# Patient Record
Sex: Female | Born: 1945 | Race: White | Hispanic: No | Smoking: Current every day smoker
Health system: Southern US, Community
[De-identification: ages and names within clinical notes are randomized; demographics above are authoritative.]

## PROBLEM LIST (undated history)

## (undated) DIAGNOSIS — T7840XA Allergy, unspecified, initial encounter: Secondary | ICD-10-CM

## (undated) DIAGNOSIS — I10 Essential (primary) hypertension: Secondary | ICD-10-CM

## (undated) DIAGNOSIS — F329 Major depressive disorder, single episode, unspecified: Secondary | ICD-10-CM

## (undated) DIAGNOSIS — J45909 Unspecified asthma, uncomplicated: Secondary | ICD-10-CM

## (undated) DIAGNOSIS — M1990 Unspecified osteoarthritis, unspecified site: Secondary | ICD-10-CM

## (undated) DIAGNOSIS — R32 Unspecified urinary incontinence: Secondary | ICD-10-CM

## (undated) DIAGNOSIS — J439 Emphysema, unspecified: Secondary | ICD-10-CM

## (undated) DIAGNOSIS — F1010 Alcohol abuse, uncomplicated: Secondary | ICD-10-CM

## (undated) DIAGNOSIS — F32A Depression, unspecified: Secondary | ICD-10-CM

## (undated) DIAGNOSIS — Z8619 Personal history of other infectious and parasitic diseases: Secondary | ICD-10-CM

## (undated) HISTORY — DX: Alcohol abuse, uncomplicated: F10.10

## (undated) HISTORY — DX: Personal history of other infectious and parasitic diseases: Z86.19

## (undated) HISTORY — DX: Major depressive disorder, single episode, unspecified: F32.9

## (undated) HISTORY — DX: Emphysema, unspecified: J43.9

## (undated) HISTORY — DX: Essential (primary) hypertension: I10

## (undated) HISTORY — DX: Unspecified osteoarthritis, unspecified site: M19.90

## (undated) HISTORY — DX: Allergy, unspecified, initial encounter: T78.40XA

## (undated) HISTORY — DX: Depression, unspecified: F32.A

## (undated) HISTORY — DX: Unspecified urinary incontinence: R32

## (undated) HISTORY — DX: Unspecified asthma, uncomplicated: J45.909

---

## 1983-06-13 HISTORY — PX: TUBAL LIGATION: SHX77

## 2016-07-28 DIAGNOSIS — Z720 Tobacco use: Secondary | ICD-10-CM | POA: Diagnosis not present

## 2016-07-28 DIAGNOSIS — G4730 Sleep apnea, unspecified: Secondary | ICD-10-CM | POA: Diagnosis not present

## 2016-12-20 DIAGNOSIS — E559 Vitamin D deficiency, unspecified: Secondary | ICD-10-CM | POA: Diagnosis not present

## 2016-12-20 DIAGNOSIS — E038 Other specified hypothyroidism: Secondary | ICD-10-CM | POA: Diagnosis not present

## 2016-12-20 DIAGNOSIS — R748 Abnormal levels of other serum enzymes: Secondary | ICD-10-CM | POA: Diagnosis not present

## 2016-12-20 DIAGNOSIS — I10 Essential (primary) hypertension: Secondary | ICD-10-CM | POA: Diagnosis not present

## 2016-12-20 DIAGNOSIS — D7589 Other specified diseases of blood and blood-forming organs: Secondary | ICD-10-CM | POA: Diagnosis not present

## 2016-12-20 DIAGNOSIS — Z6820 Body mass index (BMI) 20.0-20.9, adult: Secondary | ICD-10-CM | POA: Diagnosis not present

## 2016-12-20 DIAGNOSIS — J441 Chronic obstructive pulmonary disease with (acute) exacerbation: Secondary | ICD-10-CM | POA: Diagnosis not present

## 2016-12-20 DIAGNOSIS — E7800 Pure hypercholesterolemia, unspecified: Secondary | ICD-10-CM | POA: Diagnosis not present

## 2016-12-20 DIAGNOSIS — R945 Abnormal results of liver function studies: Secondary | ICD-10-CM | POA: Diagnosis not present

## 2016-12-30 DIAGNOSIS — L301 Dyshidrosis [pompholyx]: Secondary | ICD-10-CM | POA: Diagnosis not present

## 2016-12-30 DIAGNOSIS — S90822A Blister (nonthermal), left foot, initial encounter: Secondary | ICD-10-CM | POA: Diagnosis not present

## 2016-12-30 DIAGNOSIS — L089 Local infection of the skin and subcutaneous tissue, unspecified: Secondary | ICD-10-CM | POA: Diagnosis not present

## 2017-02-20 DIAGNOSIS — Z23 Encounter for immunization: Secondary | ICD-10-CM | POA: Diagnosis not present

## 2017-06-27 DIAGNOSIS — E7800 Pure hypercholesterolemia, unspecified: Secondary | ICD-10-CM | POA: Diagnosis not present

## 2017-06-27 DIAGNOSIS — E559 Vitamin D deficiency, unspecified: Secondary | ICD-10-CM | POA: Diagnosis not present

## 2017-06-27 DIAGNOSIS — E038 Other specified hypothyroidism: Secondary | ICD-10-CM | POA: Diagnosis not present

## 2017-06-27 DIAGNOSIS — I10 Essential (primary) hypertension: Secondary | ICD-10-CM | POA: Diagnosis not present

## 2017-06-27 DIAGNOSIS — R748 Abnormal levels of other serum enzymes: Secondary | ICD-10-CM | POA: Diagnosis not present

## 2017-06-27 DIAGNOSIS — D7589 Other specified diseases of blood and blood-forming organs: Secondary | ICD-10-CM | POA: Diagnosis not present

## 2017-08-17 DIAGNOSIS — J209 Acute bronchitis, unspecified: Secondary | ICD-10-CM | POA: Diagnosis not present

## 2018-03-05 DIAGNOSIS — Z23 Encounter for immunization: Secondary | ICD-10-CM | POA: Diagnosis not present

## 2018-05-28 ENCOUNTER — Encounter: Payer: Self-pay | Admitting: Family Medicine

## 2018-05-28 ENCOUNTER — Ambulatory Visit (INDEPENDENT_AMBULATORY_CARE_PROVIDER_SITE_OTHER): Payer: Medicare Other | Admitting: Family Medicine

## 2018-05-28 VITALS — BP 122/60 | HR 84 | Temp 98.2°F | Resp 16 | Ht 64.0 in | Wt 140.4 lb

## 2018-05-28 DIAGNOSIS — F324 Major depressive disorder, single episode, in partial remission: Secondary | ICD-10-CM | POA: Diagnosis not present

## 2018-05-28 DIAGNOSIS — J449 Chronic obstructive pulmonary disease, unspecified: Secondary | ICD-10-CM

## 2018-05-28 DIAGNOSIS — I10 Essential (primary) hypertension: Secondary | ICD-10-CM

## 2018-05-28 DIAGNOSIS — F17200 Nicotine dependence, unspecified, uncomplicated: Secondary | ICD-10-CM

## 2018-05-28 DIAGNOSIS — F329 Major depressive disorder, single episode, unspecified: Secondary | ICD-10-CM | POA: Insufficient documentation

## 2018-05-28 MED ORDER — ALBUTEROL SULFATE HFA 108 (90 BASE) MCG/ACT IN AERS: 2.0000 | INHALATION_SPRAY | Freq: Four times a day (QID) | RESPIRATORY_TRACT | 6 refills | Status: AC | PRN

## 2018-05-28 MED ORDER — BENAZEPRIL-HYDROCHLOROTHIAZIDE 20-12.5 MG PO TABS: 1.0000 | ORAL_TABLET | Freq: Every day | ORAL | 3 refills | Status: DC

## 2018-05-28 NOTE — Progress Notes (Signed)
HPI:   Lorraine Fitzgerald is a 72 y.o. female, who is here today to establish care.  Former PCP: Dianna Limbo, NP. She just moved from [place]. Last preventive routine visit: 07/2017.  Chronic medical problems: COPD,knee OA,allergic rhinitis,depression,HTN She lives alone, her daughter lives in town.  HTN: Dx in 2009. She does not check BP. Last eye exam a year ago. She is on Benazepril-HCTZ 20-12.5 mg daily.  Denies severe/frequent headache, visual changes, chest pain, palpitation, claudication, focal weakness, or edema.  COPD,she is on Albuterol inh prn and Fluticonasone-Salmeterol inh 2 puff daily. Denies worsening cough, SOB,or wheezing. Symptoms exacerbated by weather changes.  Still smoker.   Depression,Dx in 1995. Past Hx of alcohol abuse. She attends AA meetings, quit about 2 years ago.  She is on Sertraline 100 mg daily,which is helping with symptoms. Denies depressed mood or suicidal thoughts.    Review of Systems  Constitutional: Negative for activity change, appetite change, fatigue, fever and unexpected weight change.  HENT: Negative for mouth sores, nosebleeds and trouble swallowing.   Eyes: Negative for redness and visual disturbance.  Respiratory: Positive for cough, shortness of breath and wheezing.   Cardiovascular: Negative for chest pain, palpitations and leg swelling.  Gastrointestinal: Negative for abdominal pain, nausea and vomiting.       Negative for changes in bowel habits.  Genitourinary: Negative for decreased urine volume, dysuria and hematuria.  Musculoskeletal: Positive for arthralgias. Negative for gait problem.  Neurological: Negative for syncope, weakness and headaches.  Psychiatric/Behavioral: Negative for confusion. The patient is nervous/anxious.       Current Outpatient Medications on File Prior to Visit  Medication Sig Dispense Refill  . ALBUTEROL IN Inhale into the lungs as needed.    . Arnica 20 % TINC Apply  topically as needed. topical    . azelastine (ASTELIN) 0.1 % nasal spray Place 1 spray into both nostrils 2 (two) times daily. Use in each nostril as directed    . betamethasone valerate (VALISONE) 0.1 % cream Apply 1 application topically as needed.    Marland Kitchen CALCIUM-MAG-VIT C-VIT D PO Take by mouth. Liquid, uses off and on    . Fluticasone-Salmeterol (WIXELA INHUB) 250-50 MCG/DOSE AEPB Inhale 1 puff into the lungs 2 (two) times daily.    . Multiple Vitamin (MULTIVITAMIN) tablet Take 1 tablet by mouth daily.    . Nettle, Urtica Dioica, (NETTLE LEAF PO) Take 500 mg by mouth as needed.    . sertraline (ZOLOFT) 100 MG tablet Take 100 mg by mouth daily.     No current facility-administered medications on file prior to visit.      Past Medical History:  Diagnosis Date  . Alcohol abuse   . Allergy   . Arthritis   . Asthma   . Depression   . Emphysema of lung (HCC)   . History of chicken pox   . Hypertension   . Urine incontinence    No Known Allergies  Family History  Problem Relation Age of Onset  . COPD Mother   . Early death Mother   . Miscarriages / [place] Mother   . Rheumatic fever Mother   . Hearing loss Father   . Alcohol abuse Brother   . Cancer Brother   . Drug abuse Brother   . Heart disease Brother   . Alcohol abuse Son   . Arthritis Son   . Drug abuse Son   . Learning disabilities Son     Social History  Socioeconomic History  . Marital status: Widowed    Spouse name: Not on file  . Number of children: 3  . Years of education: Not on file  . Highest education level: Not on file  Occupational History  . Not on file  Social Needs  . Financial resource strain: Not on file  . Food insecurity:    Worry: Not on file    Inability: Not on file  . Transportation needs:    Medical: Not on file    Non-medical: Not on file  Tobacco Use  . Smoking status: Current Every Day Smoker  . Smokeless tobacco: Never Used  Substance and Sexual Activity  . Alcohol  use: Not Currently    Frequency: Never  . Drug use: Never  . Sexual activity: Not Currently  Lifestyle  . Physical activity:    Days per week: Not on file    Minutes per session: Not on file  . Stress: Not on file  Relationships  . Social connections:    Talks on phone: Not on file    Gets together: Not on file    Attends religious service: Not on file    Active member of club or organization: Not on file    Attends meetings of clubs or organizations: Not on file    Relationship status: Not on file  Other Topics Concern  . Not on file  Social History Narrative  . Not on file    Vitals:   05/28/18 1449  BP: 122/60  Pulse: 84  Resp: 16  Temp: 98.2 F (36.8 C)  SpO2: 95%    Body mass index is 24.1 kg/m.   Physical Exam  Nursing note and vitals reviewed. Constitutional: She is oriented to person, place, and time. She appears well-developed and well-nourished. No distress.  HENT:  Head: Normocephalic and atraumatic.  Mouth/Throat: Oropharynx is clear and moist and mucous membranes are normal.  Eyes: Pupils are equal, round, and reactive to light. Conjunctivae are normal.  Cardiovascular: Normal rate and regular rhythm.  No murmur heard. Pulses:      Dorsalis pedis pulses are 2+ on the right side and 2+ on the left side.  Respiratory: Effort normal and breath sounds normal. No respiratory distress.  GI: Soft. She exhibits no mass. There is no hepatomegaly. There is no abdominal tenderness.  Musculoskeletal:        General: No edema.  Lymphadenopathy:    She has no cervical adenopathy.  Neurological: She is alert and oriented to person, place, and time. She has normal strength. She displays tremor. No cranial nerve deficit. Gait normal.  Skin: Skin is warm. No rash noted. No erythema.  Psychiatric: Her mood appears anxious.  Well groomed, good eye contact.    ASSESSMENT AND PLAN:  Lorraine Fitzgerald was seen today for establish care.  Diagnoses and all orders for this  visit:  Chronic obstructive pulmonary disease, unspecified COPD type (HCC) Well controlled. No changes in current management. Encouraged smoking cessation.  -     albuterol (PROVENTIL HFA) 108 (90 Base) MCG/ACT inhaler; Inhale 2 puffs into the lungs every 6 (six) hours as needed for wheezing or shortness of breath.  Hypertension, essential, benign Adequately controlled. No changes in current management. DASH diet recommended. Eye exam recommended annually. F/U in 6 months, before if needed.  -     benazepril-hydrochlorthiazide (LOTENSIN HCT) 20-12.5 MG tablet; Take 1 tablet by mouth daily.  Tobacco use disorder She understands adverse effects of smoking cessation as  well as benefits of smoking cessation. She is not interested in smoking cessation for now.  Major depressive disorder in partial remission, unspecified whether recurrent (HCC)  Stable. No changes in Zoloft 100 mg daily. Instructed about warning signs.   Return in about 2 months (around 07/29/2018) for routine and f/u.      Chabeli Barsamian G. [place], MD  [facility]. [place] [facility].

## 2018-05-28 NOTE — Patient Instructions (Addendum)
A few things to remember from today's visit:   Chronic obstructive pulmonary disease, unspecified COPD type (HCC) - Plan: albuterol (PROVENTIL HFA) 108 (90 Base) MCG/ACT inhaler  Hypertension, essential, benign - Plan: benazepril-hydrochlorthiazide (LOTENSIN HCT) 20-12.5 MG tablet   Please be sure medication list is accurate. If a new problem present, please set up appointment sooner than planned today.

## 2018-06-01 DIAGNOSIS — J449 Chronic obstructive pulmonary disease, unspecified: Secondary | ICD-10-CM | POA: Insufficient documentation

## 2018-06-01 DIAGNOSIS — I10 Essential (primary) hypertension: Secondary | ICD-10-CM | POA: Insufficient documentation

## 2018-06-01 DIAGNOSIS — F17200 Nicotine dependence, unspecified, uncomplicated: Secondary | ICD-10-CM | POA: Insufficient documentation

## 2018-08-06 ENCOUNTER — Ambulatory Visit (INDEPENDENT_AMBULATORY_CARE_PROVIDER_SITE_OTHER): Payer: Medicare Other | Admitting: Family Medicine

## 2018-08-06 ENCOUNTER — Encounter: Payer: Self-pay | Admitting: Family Medicine

## 2018-08-06 ENCOUNTER — Ambulatory Visit (INDEPENDENT_AMBULATORY_CARE_PROVIDER_SITE_OTHER): Payer: Medicare Other

## 2018-08-06 VITALS — BP 120/72 | HR 67 | Temp 97.9°F | Resp 12 | Ht 64.0 in | Wt 136.4 lb

## 2018-08-06 DIAGNOSIS — Z0000 Encounter for general adult medical examination without abnormal findings: Secondary | ICD-10-CM | POA: Diagnosis not present

## 2018-08-06 DIAGNOSIS — Z1159 Encounter for screening for other viral diseases: Secondary | ICD-10-CM | POA: Diagnosis not present

## 2018-08-06 DIAGNOSIS — L298 Other pruritus: Secondary | ICD-10-CM | POA: Diagnosis not present

## 2018-08-06 DIAGNOSIS — L2989 Other pruritus: Secondary | ICD-10-CM

## 2018-08-06 DIAGNOSIS — R0609 Other forms of dyspnea: Secondary | ICD-10-CM | POA: Diagnosis not present

## 2018-08-06 DIAGNOSIS — J449 Chronic obstructive pulmonary disease, unspecified: Secondary | ICD-10-CM

## 2018-08-06 DIAGNOSIS — E785 Hyperlipidemia, unspecified: Secondary | ICD-10-CM | POA: Diagnosis not present

## 2018-08-06 DIAGNOSIS — Z9189 Other specified personal risk factors, not elsewhere classified: Secondary | ICD-10-CM

## 2018-08-06 DIAGNOSIS — F324 Major depressive disorder, single episode, in partial remission: Secondary | ICD-10-CM | POA: Diagnosis not present

## 2018-08-06 DIAGNOSIS — I10 Essential (primary) hypertension: Secondary | ICD-10-CM | POA: Diagnosis not present

## 2018-08-06 LAB — CBC
HCT: 45.3 % (ref 36.0–46.0)
Hemoglobin: 15.3 g/dL — ABNORMAL HIGH (ref 12.0–15.0)
MCHC: 33.8 g/dL (ref 30.0–36.0)
MCV: 94.6 fl (ref 78.0–100.0)
Platelets: 247 10*3/uL (ref 150.0–400.0)
RBC: 4.79 Mil/uL (ref 3.87–5.11)
RDW: 13.2 % (ref 11.5–15.5)
WBC: 5.5 10*3/uL (ref 4.0–10.5)

## 2018-08-06 LAB — BASIC METABOLIC PANEL
BUN: 10 mg/dL (ref 6–23)
CO2: 28 mEq/L (ref 19–32)
Calcium: 9.8 mg/dL (ref 8.4–10.5)
Chloride: 100 mEq/L (ref 96–112)
Creatinine, Ser: 0.59 mg/dL (ref 0.40–1.20)
GFR: 100.18 mL/min (ref >60.00)
Glucose, Bld: 82 mg/dL (ref 70–99)
POTASSIUM: 4.6 meq/L (ref 3.5–5.1)
Sodium: 139 mEq/L (ref 135–145)

## 2018-08-06 LAB — LIPID PANEL
Cholesterol: 228 mg/dL — ABNORMAL HIGH (ref 0–200)
HDL: 89.7 mg/dL (ref >39.00)
LDL Cholesterol: 120 mg/dL — ABNORMAL HIGH (ref 0–99)
NonHDL: 138.05
Total CHOL/HDL Ratio: 3
Triglycerides: 89 mg/dL (ref 0.0–149.0)
VLDL: 17.8 mg/dL (ref 0.0–40.0)

## 2018-08-06 MED ORDER — TIOTROPIUM BROMIDE MONOHYDRATE 18 MCG IN CAPS: 18.0000 ug | ORAL_CAPSULE | Freq: Every day | RESPIRATORY_TRACT | 1 refills | Status: DC

## 2018-08-06 NOTE — Assessment & Plan Note (Signed)
Stable and well controlled with Sertraline 25 mg daily. Instructed about warning signs.

## 2018-08-06 NOTE — Progress Notes (Signed)
HPI:   Lorraine Fitzgerald is a 73 y.o. female, who is here today for chronic disease management and Medicare Preventive visit.  She was last seen on 05/28/18.  Today she is c/o a months worsening exertional dyspnea.  Denies associated chest pain,palitations,or diaphoresis. COPD currently on Xixela inh 250-50 mcg bid and Albuterol inh prn.  Alleviated by rest and improved after using Albuterol inh.  Cough and wheezing seem to stable.   Rhinorrhea and nasal congestion,worse in the morning. She has Astelin nasal spray before,not using it currently.  She is also c/o pruritis erythematous rash, which she has had intermittent for many years. Currently she has rash on flexor areas of upper extremities. She has also had same rash on LE's,behind knees.  She has followed with derma years ago,recommended topical steroid. She is not sure about Dx. She has not identified exacerbating factors.  It has been getting worse for for the past few weeks. She is using Triamcinolone cream,it is helping little.   HTN: Currently on Benazepril-HCTZ 20-12.5 mg daily. Denies severe/frequent headache, visual changes, chest pain, dyspnea, palpitation, claudication, focal weakness, or edema.   HLD: Currently on non pharmacologic treatment. Last FLP in 2015: TC 210,HLD 89,TG 95, and LDL 102.  She exercises regularly,yoga.  She lives alone. Independent for most ADL's,exept for urine incontinence. She wears pull ups and wonders if she can get them covered by her health insurance. Independent IADL's. No falls in the past year and denies depression symptoms. Hx of depression and anxiety,she is on Sertraline 25 mg. Past Hx of alcohol abuse,she is attending AA meetings.   Functional Status Survey: Is the patient deaf or have difficulty hearing?: Yes(In a noisy room) Does the patient have difficulty seeing, even when wearing glasses/contacts?: No Does the patient have difficulty concentrating,  remembering, or making decisions?: No Does the patient have difficulty walking or climbing stairs?: No Does the patient have difficulty dressing or bathing?: No Does the patient have difficulty doing errands alone such as visiting a doctor's office or shopping?: No  Fall Risk  08/06/2018  Falls in the past year? 0  Number falls in past yr: 0  Injury with Fall? 0  Follow up Education provided    Providers she sees regularly:  Eye care provider: She has not established with provider. She saw Dr Eliberto Ivory in [place]. She has not established with provider in this area.  Depression screen [facility] 2/9 08/06/2018  Decreased Interest 0  Down, Depressed, Hopeless 0  PHQ - 2 Score 0    Mini-Cog - 08/06/18 2105    Normal clock drawing test?  yes    How many words correct?  3        Visual Acuity Screening   Right eye Left eye Both eyes  Without correction:  With correction:        Review of Systems  Constitutional: Negative for activity change, appetite change, fatigue and fever.  HENT: Positive for congestion and hearing loss. Negative for mouth sores, nosebleeds, sore throat and trouble swallowing.   Eyes: Negative for redness and visual disturbance.  Respiratory: Positive for cough, shortness of breath and wheezing.   Cardiovascular: Negative for chest pain, palpitations and leg swelling.  Gastrointestinal: Negative for abdominal pain, nausea and vomiting.       Negative for changes in bowel habits.  Endocrine: Negative for polydipsia, polyphagia and polyuria.  Genitourinary: Negative for decreased urine volume and hematuria.  Skin: Positive for rash  and wound.  Allergic/Immunologic: Positive for environmental allergies.  Neurological: Negative for syncope, weakness and headaches.  Psychiatric/Behavioral: Negative for confusion. The patient is nervous/anxious.     Current Outpatient Medications on File Prior to Visit  Medication Sig Dispense Refill  .  albuterol (PROVENTIL HFA) 108 (90 Base) MCG/ACT inhaler Inhale 2 puffs into the lungs every 6 (six) hours as needed for wheezing or shortness of breath. (Patient taking differently: Inhale 2 puffs into the lungs every 6 (six) hours as needed for wheezing or shortness of breath. ) 18 g 6  . ALBUTEROL IN Inhale into the lungs as needed.    . Arnica 20 % TINC Apply topically as needed. topical    . azelastine (ASTELIN) 0.1 % nasal spray Place 1 spray into both nostrils 2 (two) times daily. Use in each nostril as directed    . benazepril-hydrochlorthiazide (LOTENSIN HCT) 20-12.5 MG tablet Take 1 tablet by mouth daily. 90 tablet 3  . betamethasone valerate (VALISONE) 0.1 % cream Apply 1 application topically as needed.    Marland Kitchen CALCIUM-MAG-VIT C-VIT D PO Take by mouth. Liquid, uses off and on    . Fluticasone-Salmeterol (WIXELA INHUB) 250-50 MCG/DOSE AEPB Inhale 1 puff into the lungs 2 (two) times daily.    . Multiple Vitamin (MULTIVITAMIN) tablet Take 1 tablet by mouth daily.    . sertraline (ZOLOFT) 100 MG tablet Take 100 mg by mouth daily.    . Nettle, Urtica Dioica, (NETTLE LEAF PO) Take 500 mg by mouth as needed.     No current facility-administered medications on file prior to visit.     Past Medical History:  Diagnosis Date  . Alcohol abuse   . Allergy   . Arthritis   . Asthma   . Depression   . Emphysema of lung (HCC)   . History of chicken pox   . Hypertension   . Urine incontinence    No Known Allergies  Social History   Socioeconomic History  . Marital status: Widowed    Spouse name: Not on file  . Number of children: 3  . Years of education: Not on file  . Highest education level: Not on file  Occupational History  . Not on file  Social Needs  . Financial resource strain: Not on file  . Food insecurity:    Worry: Not on file    Inability: Not on file  . Transportation needs:    Medical: Not on file    Non-medical: Not on file  Tobacco Use  . Smoking status: Current  Every Day Smoker  . Smokeless tobacco: Never Used  Substance and Sexual Activity  . Alcohol use: Not Currently    Frequency: Never  . Drug use: Never  . Sexual activity: Not Currently  Lifestyle  . Physical activity:    Days per week: Not on file    Minutes per session: Not on file  . Stress: Not on file  Relationships  . Social connections:    Talks on phone: Not on file    Gets together: Not on file    Attends religious service: Not on file    Active member of club or organization: Not on file    Attends meetings of clubs or organizations: Not on file    Relationship status: Not on file  Other Topics Concern  . Not on file  Social History Narrative  . Not on file    Vitals:   08/06/18 1401  BP: 120/72  Pulse: 67  Resp: 12  Temp: 97.9 F (36.6 C)  SpO2: 97%   Body mass index is 23.41 kg/m.   Physical Exam  Nursing note and vitals reviewed. Constitutional: She is oriented to person, place, and time. She appears well-developed and well-nourished. No distress.  HENT:  Head: Normocephalic and atraumatic.  Right Ear: Tympanic membrane and ear canal normal.  Left Ear: Tympanic membrane, external ear and ear canal normal.  Mouth/Throat: Oropharynx is clear and moist and mucous membranes are normal.  Eyes: Pupils are equal, round, and reactive to light. Conjunctivae are normal.  Cardiovascular: Normal rate and regular rhythm.  No murmur heard. Pulses:      Dorsalis pedis pulses are 2+ on the right side and 2+ on the left side.  Respiratory: Effort normal and breath sounds normal. No respiratory distress.  GI: Soft. She exhibits no mass. There is no hepatomegaly. There is no abdominal tenderness.  Musculoskeletal:        General: No edema.  Lymphadenopathy:    She has no cervical adenopathy.  Neurological: She is alert and oriented to person, place, and time. She has normal strength. No cranial nerve deficit.  Reflex Scores:      Bicep reflexes are 2+ on the right  side and 2+ on the left side.      Patellar reflexes are 2+ on the right side and 2+ on the left side. Stable gait. Get and go test < 15 sec.  Skin: Skin is warm. Rash noted. Rash is macular. There is erythema.  Macular ,erythematous rash on anterior aspect of upper extremities. Finely scaly/dry skin.  Right hand with scaly and erythematous rash affecting left index. Linear superficial excoriation on palmar MCP joint. See picture.  Psychiatric: Her mood appears anxious.  Well groomed, good eye contact.         Right hand.   ASSESSMENT AND PLAN:  Lorraine Fitzgerald was seen today for chronic disease management and Preventive Medicare visit.  Orders Placed This Encounter  Procedures  . DG Chest 2 View  . Basic metabolic panel  . CBC  . Lipid panel  . Hepatitis C antibody screen  . Ambulatory referral to Dermatology  . Visual acuity screening (in office)   Lab Results  Component Value Date   CHOL 228 (H) 08/06/2018   HDL 89.70 08/06/2018   LDLCALC 120 (H) 08/06/2018   TRIG 89.0 08/06/2018   CHOLHDL 3 08/06/2018   Lab Results  Component Value Date   WBC 5.5 08/06/2018   HGB 15.3 (H) 08/06/2018   HCT 45.3 08/06/2018   MCV 94.6 08/06/2018   PLT 247.0 08/06/2018   Lab Results  Component Value Date   CREATININE 0.59 08/06/2018   BUN 10 08/06/2018   NA 139 08/06/2018   K 4.6 08/06/2018   CL 100 08/06/2018   CO2 28 08/06/2018    Medicare annual wellness visit, subsequent We discussed the importance of staying active, physically and mentally, as well as the benefits of a healthy/balnace diet. Low impact exercise that involve stretching and strengthing are ideal. Vaccines: Needs Prevnar 13 We discussed preventive screening for the next 5-10 years, summery of recommendations given in AVS: She is not interested in further screening testing. She is not interested in vaccination.  Fall prevention.  Advance directives and end of life discussed, she has POA and  living will, asked her to provide a copy.   Hyperlipidemia, unspecified hyperlipidemia type Continue low fat diet. Further recommendations according to lipid panel results +  10 years CVD risk.  The 10-year ASCVD risk score Denman George DC Montez Hageman., et al., 2013) is: 20.3%   Values used to calculate the score:     Age: 16 years     Sex: Female     Is Non-Hispanic African American: No     Diabetic: No     Tobacco smoker: Yes     Systolic Blood Pressure: 120 mmHg     Is BP treated: Yes     HDL Cholesterol: 89.7 mg/dL     Total Cholesterol: 228 mg/dL  - Lipid panel  Encounter for HCV screening test for high risk patient - Hepatitis C antibody screen  Pruritic erythematous rash ? Eczema. Other possible etiologies discussed. Topical steroid has not helped. Derma appt will be arranged.  - Ambulatory referral to Dermatology  Exertional dyspnea She is concerned about cardiac etiology. Nasal congestion and rhinorrhea could contribute to problem. Neg for CP,diaphoresis,or palpitations. Offered cardiology referral but she prefers to hold on it. Clearly instructed about warning signs. Further recommendations according to CXR results.  - DG Chest 2 View; Future  Hypertension, essential, benign Adequately controlled. No changes in current management. Continue low salt diet. Eye exam recommended annually.  Chronic obstructive pulmonary disease (HCC) Problem is not well controlled. Spiriva added today. No changes in rest of medications. Encouraged smoking cessation.  Depression, major, in partial remission (HCC) Stable and well controlled with Sertraline 25 mg daily. Instructed about warning signs.    Return in 3 months (on 11/04/2018) for COPD.      Sajjad Honea G. [place], MD  [facility]. [place] office.

## 2018-08-06 NOTE — Assessment & Plan Note (Addendum)
Adequately controlled. No changes in current management. Continue low salt diet. Eye exam recommended annually. 

## 2018-08-06 NOTE — Assessment & Plan Note (Signed)
Problem is not well controlled. Spiriva added today. No changes in rest of medications. Encouraged smoking cessation.

## 2018-08-06 NOTE — Patient Instructions (Addendum)
A few things to remember from today's visit:   Medicare annual wellness visit, subsequent - Plan: Visual acuity screening (in office)  Hypertension, essential, benign - Plan: Basic metabolic panel, CBC  Hyperlipidemia, unspecified hyperlipidemia type - Plan: Lipid panel  Encounter for HCV screening test for high risk patient - Plan: Hepatitis C antibody screen  Pruritic erythematous rash - Plan: Ambulatory referral to Dermatology  Exertional dyspnea - Plan: DG Chest 2 View  Chronic obstructive pulmonary disease, unspecified COPD type ([place]) - Plan: tiotropium (SPIRIVA HANDIHALER) 18 MCG inhalation capsule   A few tips:  -As we age balance is not as good as it was, so there is a higher risks for falls. Please remove small rugs and furniture that is "in your way" and could increase the risk of falls. Stretching exercises may help with fall prevention: Yoga and Tai Chi are some examples. Low impact exercise is better, so you are not very achy the next day.  -Sun screen and avoidance of direct sun light recommended. Caution with dehydration, if working outdoors be sure to drink enough fluids.  - Some medications are not safe as we age, increases the risk of side effects and can potentially interact with other medication you are also taken;  including some of over the counter medications. Be sure to let me know when you start a new medication even if it is a dietary/vitamin supplement.   -Healthy diet low in red meet/animal fat and sugar + regular physical activity is recommended.       Screening schedule for the next 5-10 years:   Diabetes and cholesterol annual   Fall prevention    Please be sure medication list is accurate. If a new problem present, please set up appointment sooner than planned today.

## 2018-08-07 LAB — HEPATITIS C ANTIBODY
Hepatitis C Ab: NONREACTIVE
SIGNAL TO CUT-OFF: 0.01 (ref <1.00)

## 2018-08-20 ENCOUNTER — Ambulatory Visit (INDEPENDENT_AMBULATORY_CARE_PROVIDER_SITE_OTHER): Payer: Medicare Other | Admitting: Family Medicine

## 2018-08-20 ENCOUNTER — Encounter: Payer: Self-pay | Admitting: Family Medicine

## 2018-08-20 VITALS — HR 76 | Temp 98.0°F | Resp 16 | Ht 64.0 in | Wt 139.1 lb

## 2018-08-20 DIAGNOSIS — L309 Dermatitis, unspecified: Secondary | ICD-10-CM

## 2018-08-20 DIAGNOSIS — L298 Other pruritus: Secondary | ICD-10-CM

## 2018-08-20 DIAGNOSIS — L03119 Cellulitis of unspecified part of limb: Secondary | ICD-10-CM

## 2018-08-20 DIAGNOSIS — J449 Chronic obstructive pulmonary disease, unspecified: Secondary | ICD-10-CM

## 2018-08-20 MED ORDER — PREDNISONE 20 MG PO TABS: ORAL_TABLET | ORAL | 0 refills | Status: DC

## 2018-08-20 MED ORDER — TRIAMCINOLONE 0.1 % CREAM:EUCERIN CREAM 1:1: 1.0000 "application " | TOPICAL_CREAM | Freq: Two times a day (BID) | CUTANEOUS | 0 refills | Status: DC | PRN

## 2018-08-20 MED ORDER — SULFAMETHOXAZOLE-TRIMETHOPRIM 800-160 MG PO TABS: 1.0000 | ORAL_TABLET | Freq: Two times a day (BID) | ORAL | 0 refills | Status: AC

## 2018-08-20 MED ORDER — METHYLPREDNISOLONE ACETATE 40 MG/ML IJ SUSP
40.0000 mg | Freq: Once | INTRAMUSCULAR | Status: AC
  Administered 2018-08-20: 40 mg via INTRAMUSCULAR

## 2018-08-20 NOTE — Progress Notes (Signed)
ACUTE VISIT   HPI:  Chief Complaint  Patient presents with  . Rash    rash all over, possible cellultiis, started 2 weeks ago, red, itchy and pain on inside of arms    Lorraine Fitzgerald is a 73 y.o. female, who is here today complaining of worsening erythematous rash.  She was last seen on 08/06/2018, at that time she reported history of pruritic erythematous rash for several years. Rash localized on upper and lower extremities, she has seen dermatology in the past and according to patient, they were not sure about diagnosis. Currently she is on betamethasone. She has taken OTC Benadryl.  On 08/11/2018 she noted rash on chin and cheeks, rash spread to her neck upper chest, upper extremities, and lower extremities. Sh states that initially she had some "blisters" that did burst,leaving a dray erythematous area.  She is not sure about exacerbating factors. She has not taken new medications, no insect bites or new outdoor exposure.  She try OTC salicylic acid cream ("psoriatic medication"). Intense pruritus.  She has a few wounds on upper extremities due to scratching, some with surrounding tenderness/edema.  Denies fever, chills, unusual arthralgias/myalgias, or associated respiratory symptoms.   COPD: Still smoking. Last visit Spiriva was added, she is reporting great improvement of respiratory symptoms. She is also on Xixela inh 250-50 mcg bid and albuterol inhaler.    Review of Systems  Constitutional: Negative for appetite change, chills, fatigue and fever.  HENT: Negative for congestion, ear pain, mouth sores, sore throat, trouble swallowing and voice change.   Eyes: Negative for discharge and redness.  Respiratory: Negative for cough, shortness of breath and wheezing.   Cardiovascular: Negative for chest pain, palpitations and leg swelling.  Gastrointestinal: Negative for abdominal pain, diarrhea, nausea and vomiting.  Musculoskeletal: Negative for myalgias.    Skin: Positive for rash.  Allergic/Immunologic: Negative for environmental allergies and food allergies.  Neurological: Negative for weakness, numbness and headaches.   Current Outpatient Medications on File Prior to Visit  Medication Sig Dispense Refill  . albuterol (PROVENTIL HFA) 108 (90 Base) MCG/ACT inhaler Inhale 2 puffs into the lungs every 6 (six) hours as needed for wheezing or shortness of breath. (Patient taking differently: Inhale 2 puffs into the lungs every 6 (six) hours as needed for wheezing or shortness of breath. ) 18 g 6  . ALBUTEROL IN Inhale into the lungs as needed.    . Arnica 20 % TINC Apply topically as needed. topical    . azelastine (ASTELIN) 0.1 % nasal spray Place 1 spray into both nostrils 2 (two) times daily. Use in each nostril as directed    . benazepril-hydrochlorthiazide (LOTENSIN HCT) 20-12.5 MG tablet Take 1 tablet by mouth daily. 90 tablet 3  . betamethasone valerate (VALISONE) 0.1 % cream Apply 1 application topically as needed.    Marland Kitchen CALCIUM-MAG-VIT C-VIT D PO Take by mouth. Liquid, uses off and on    . Fluticasone-Salmeterol (WIXELA INHUB) 250-50 MCG/DOSE AEPB Inhale 1 puff into the lungs 2 (two) times daily.    . Multiple Vitamin (MULTIVITAMIN) tablet Take 1 tablet by mouth daily.    . Nettle, Urtica Dioica, (NETTLE LEAF PO) Take 500 mg by mouth as needed.    . sertraline (ZOLOFT) 100 MG tablet Take 100 mg by mouth daily.    Marland Kitchen tiotropium (SPIRIVA HANDIHALER) 18 MCG inhalation capsule Place 1 capsule (18 mcg total) into inhaler and inhale daily. 90 capsule 1   No current facility-administered  medications on file prior to visit.      Past Medical History:  Diagnosis Date  . Alcohol abuse   . Allergy   . Arthritis   . Asthma   . Depression   . Emphysema of lung (HCC)   . History of chicken pox   . Hypertension   . Urine incontinence    No Known Allergies  Social History   Socioeconomic History  . Marital status: Widowed    Spouse name:  Not on file  . Number of children: 3  . Years of education: Not on file  . Highest education level: Not on file  Occupational History  . Not on file  Social Needs  . Financial resource strain: Not on file  . Food insecurity:    Worry: Not on file    Inability: Not on file  . Transportation needs:    Medical: Not on file    Non-medical: Not on file  Tobacco Use  . Smoking status: Current Every Day Smoker  . Smokeless tobacco: Never Used  Substance and Sexual Activity  . Alcohol use: Not Currently    Frequency: Never  . Drug use: Never  . Sexual activity: Not Currently  Lifestyle  . Physical activity:    Days per week: Not on file    Minutes per session: Not on file  . Stress: Not on file  Relationships  . Social connections:    Talks on phone: Not on file    Gets together: Not on file    Attends religious service: Not on file    Active member of club or organization: Not on file    Attends meetings of clubs or organizations: Not on file    Relationship status: Not on file  Other Topics Concern  . Not on file  Social History Narrative  . Not on file    Vitals:   08/20/18 1544  Pulse: 76  Resp: 16  Temp: 98 F (36.7 C)   Body mass index is 23.88 kg/m.   Physical Exam  Nursing note and vitals reviewed. Constitutional: She is oriented to person, place, and time. She appears well-developed and well-nourished. No distress.  HENT:  Head: Normocephalic and atraumatic.  Mouth/Throat: Oropharynx is clear and moist and mucous membranes are normal.  Eyes: Conjunctivae are normal.  Respiratory: Effort normal and breath sounds normal. No respiratory distress.  Musculoskeletal:        General: No edema.  Lymphadenopathy:    She has no cervical adenopathy.  Neurological: She is alert and oriented to person, place, and time.  Skin: Skin is warm. Abrasion and rash noted. No ecchymosis noted. Rash is maculopapular. Rash is not vesicular. There is erythema.  Tenderness  upon palpation around superficial excoriation on upper extremities.  See picture,taken after pt consent.  Psychiatric: Her speech is normal. Her mood appears anxious.  Fairly groomed, good eye contact.          ASSESSMENT AND PLAN:  [name] was seen today for rash.  Diagnoses and all orders for this visit:  Pruritic erythematous rash Here today after verbal consent she received Depo-Medrol 40 mg IM. Recommend starting prednisone tomorrow morning. OTC Sarna lotion may help with pruritus. Caution with Benadryl. Recommend Pepcid 20 mg twice daily, which can also help with pruritus.  -     predniSONE (DELTASONE) 20 MG tablet; 2 tabs for 5 days, 1 tabs for 5 days, and 1/2 tab for 3 days. Take tables together with breakfast. -  Triamcinolone Acetonide (TRIAMCINOLONE 0.1 % CREAM : EUCERIN) CREA; Apply 1 application topically 2 (two) times daily as needed for rash or itching. -     methylPREDNISolone acetate (DEPO-MEDROL) injection 40 mg  Dermatitis ? Eczema,psoriasis among some to consider. We discussed side effects of prednisone, she has taken medication before. Instructed about warning signs. Pending appointment with dermatologist, recommend checking daily in case there is a cancellation, so she can be seen earlier than scheduled.  -     predniSONE (DELTASONE) 20 MG tablet; 2 tabs for 5 days, 1 tabs for 5 days, and 1/2 tab for 3 days. Take tables together with breakfast. -     methylPREDNISolone acetate (DEPO-MEDROL) injection 40 mg  Cellulitis of upper extremity, unspecified laterality Around superficial excoriations tenderness and edema,so abx recommended. Clearly instructed about warning signs.  -     sulfamethoxazole-trimethoprim (BACTRIM DS,SEPTRA DS) 800-160 MG tablet; Take 1 tablet by mouth 2 (two) times daily for 7 days.   Chronic obstructive pulmonary disease (HCC) Symptoms improved. No changes in current management. General interested in smoking  cessation.    Return if symptoms worsen or fail to improve.     Idalis Hoelting G. [place], MD  [facility]. [facility].

## 2018-08-20 NOTE — Patient Instructions (Addendum)
A few things to remember from today's visit:   Pruritic erythematous rash - Plan: predniSONE (DELTASONE) 20 MG tablet, Triamcinolone Acetonide (TRIAMCINOLONE 0.1 % CREAM : EUCERIN) CREA  Dermatitis - Plan: predniSONE (DELTASONE) 20 MG tablet  Cellulitis of upper extremity, unspecified laterality - Plan: sulfamethoxazole-trimethoprim (BACTRIM DS,SEPTRA DS) 800-160 MG tablet Take prednisone in the morning with breakfast.  Over-the-counter Sarna lotion may help with itching. Try not to scratch.  Pepcid 20 mg twice daily over-the-counter.

## 2018-08-20 NOTE — Assessment & Plan Note (Signed)
Symptoms improved. No changes in current management. General interested in smoking cessation.

## 2018-08-29 ENCOUNTER — Telehealth: Payer: Self-pay | Admitting: Family Medicine

## 2018-08-29 NOTE — Telephone Encounter (Signed)
Copied from CRM [id number]. Topic: Quick Communication - Rx Refill/Question >> Aug 29, 2018  2:08 PM Arlyss Gandy, NT wrote: Medication: Triamcinolone Acetonide (TRIAMCINOLONE 0.1 % CREAM : EUCERIN) CREA  Pt states that a jar of cream was to be called in last week   Has the patient contacted their pharmacy? Yes.   (Agent: If no, request that the patient contact the pharmacy for the refill.) (Agent: If yes, when and what did the pharmacy advise?)  Preferred Pharmacy (with phone number or street name):   [id number] [place], [place] - [address] [phone] (Phone) [phone] (Fax)    Agent: Please be advised that RX refills may take up to 3 business days. We ask that you follow-up with your pharmacy.

## 2018-08-30 ENCOUNTER — Other Ambulatory Visit: Payer: Self-pay | Admitting: *Deleted

## 2018-08-30 DIAGNOSIS — L298 Other pruritus: Secondary | ICD-10-CM

## 2018-08-30 MED ORDER — TRIAMCINOLONE 0.1 % CREAM:EUCERIN CREAM 1:1: 1.0000 "application " | TOPICAL_CREAM | Freq: Two times a day (BID) | CUTANEOUS | 0 refills | Status: DC | PRN

## 2018-08-30 NOTE — Telephone Encounter (Signed)
Rx called in to pharmacy. 

## 2018-10-02 ENCOUNTER — Other Ambulatory Visit: Payer: Self-pay | Admitting: Family Medicine

## 2018-10-02 NOTE — Telephone Encounter (Signed)
Requested medication (s) are due for refill today: Yes  Requested medication (s) are on the active medication list: Yes  Last refill:  By historical provider  Future visit scheduled: No  Notes to clinic:  Unable to refill per protocol     Requested Prescriptions  Pending Prescriptions Disp Refills   azelastine (ASTELIN) 0.1 % nasal spray 30 mL     Sig: Place 1 spray into both nostrils 2 (two) times daily. Use in each nostril as directed     Ear, Nose, and Throat: Nasal Preparations - Antiallergy Passed - 10/02/2018  3:56 PM      Passed - Valid encounter within last 12 months    Recent Outpatient Visits          1 month ago Pruritic erythematous rash   [facility] conservation officer at [facility] [place], Timoteo Expose, MD   1 month ago Medicare annual wellness visit, subsequent   Nature conservation officer at [facility] [place], Timoteo Expose, MD   4 months ago Chronic obstructive pulmonary disease, unspecified COPD type (HCC)   Adult nurse [facility] [place], Timoteo Expose, MD

## 2018-10-04 MED ORDER — AZELASTINE HCL 0.1 % NA SOLN: 1.0000 | Freq: Two times a day (BID) | NASAL | 2 refills | Status: DC

## 2018-11-18 ENCOUNTER — Telehealth: Payer: Self-pay | Admitting: *Deleted

## 2018-11-18 NOTE — Telephone Encounter (Signed)
Copied from [place] [id number]. Topic: Appointment Scheduling - Scheduling Inquiry for Clinic >> Nov 18, 2018  4:19 PM Erick Blinks wrote: Reason for CRM: Pt requesting shot for rash. Cortizone, with prescription to accompany. Please advise, requesting afternoon appt. Preferably car side shot. VM okay

## 2018-11-19 NOTE — Telephone Encounter (Signed)
Left message to return call to office to schedule virtual visit to discuss with Dr. [place].

## 2018-11-20 NOTE — Telephone Encounter (Signed)
Left message for patient to return call to office to schedule virtual visit to discuss rash with pcp.

## 2018-11-20 NOTE — Telephone Encounter (Signed)
Patient schedule virtual visit with pcp on 11/22/2018 to discuss rash.

## 2018-11-22 ENCOUNTER — Other Ambulatory Visit: Payer: Self-pay

## 2018-11-22 ENCOUNTER — Ambulatory Visit (INDEPENDENT_AMBULATORY_CARE_PROVIDER_SITE_OTHER): Payer: Medicare Other | Admitting: Family Medicine

## 2018-11-22 ENCOUNTER — Encounter: Payer: Self-pay | Admitting: Family Medicine

## 2018-11-22 DIAGNOSIS — L298 Other pruritus: Secondary | ICD-10-CM

## 2018-11-22 MED ORDER — PREDNISONE 20 MG PO TABS: ORAL_TABLET | ORAL | 0 refills | Status: AC

## 2018-11-22 MED ORDER — CLOBETASOL PROPIONATE 0.05 % EX OINT: 1.0000 "application " | TOPICAL_OINTMENT | Freq: Two times a day (BID) | CUTANEOUS | 1 refills | Status: DC

## 2018-11-22 MED ORDER — TRIAMCINOLONE ACETONIDE 40 MG/ML IJ SUSP
40.0000 mg | Freq: Once | INTRAMUSCULAR | Status: AC
  Administered 2018-11-22: 40 mg via INTRAMUSCULAR

## 2018-11-22 NOTE — Progress Notes (Signed)
Virtual Visit via Video Note   I connected with Lorraine Fitzgerald on 11/22/18 at 12:00 PM EDT by a video enabled telemedicine application and verified that I am speaking with the correct person using two identifiers.  Location patient: home Location provider:work office Persons participating in the virtual visit: patient, provider  I discussed the limitations of evaluation and management by telemedicine and the availability of in person appointments. The patient expressed understanding and agreed to proceed.   HPI: Lorraine Fitzgerald is a 73 years old female with history of COPD, hypertension, tobacco use, depression, and anxiety among some, who is complaining about recurrent pruritic rash "all over." I saw her in 08/2018 with similar symptoms. She states that rash on upper extremities is not as bad as it was last visit.  She denies associated fever, chills, unusual fatigue or body aches, headache, conjunctival erythema, nasal congestion, rhinorrhea, oral lesions, sore throat, dysphagia, cough, wheezing, chest pain, abdominal pain, nausea, vomiting, or diarrhea.  She has not identified exacerbating factors.  Last visit, 08/20/2018, she received Solu-Medrol 80 mg IM and oral prednisone taper. When decreased dose of Prednisone rash started back. She is also taking Benadryl 25 mg 2 tablets daily.  Negative for sick contact or recent travel.  Last visit she was referred to dermatologist, she was evaluated once but has not been back for follow-up.  ROS: See pertinent positives and negatives per HPI.  Past Medical History:  Diagnosis Date  . Alcohol abuse   . Allergy   . Arthritis   . Asthma   . Depression   . Emphysema of lung ([place])   . History of chicken pox   . Hypertension   . Urine incontinence     Past Surgical History:  Procedure Laterality Date  . TUBAL LIGATION  1985    Family History  Problem Relation Age of Onset  . COPD Mother   . Early death Mother   . Miscarriages / [place]  Mother   . Rheumatic fever Mother   . Hearing loss Father   . Alcohol abuse Brother   . Cancer Brother   . Drug abuse Brother   . Heart disease Brother   . Alcohol abuse Son   . Arthritis Son   . Drug abuse Son   . Learning disabilities Son     Social History   Socioeconomic History  . Marital status: Widowed    Spouse name: Not on file  . Number of children: 3  . Years of education: Not on file  . Highest education level: Not on file  Occupational History  . Not on file  Social Needs  . Financial resource strain: Not on file  . Food insecurity    Worry: Not on file    Inability: Not on file  . Transportation needs    Medical: Not on file    Non-medical: Not on file  Tobacco Use  . Smoking status: Current Every Day Smoker  . Smokeless tobacco: Never Used  Substance and Sexual Activity  . Alcohol use: Not Currently    Frequency: Never  . Drug use: Never  . Sexual activity: Not Currently  Lifestyle  . Physical activity    Days per week: Not on file    Minutes per session: Not on file  . Stress: Not on file  Relationships  . Social Herbalist on phone: Not on file    Gets together: Not on file    Attends religious service: Not on  file    Active member of club or organization: Not on file    Attends meetings of clubs or organizations: Not on file    Relationship status: Not on file  . Intimate partner violence    Fear of current or ex partner: Not on file    Emotionally abused: Not on file    Physically abused: Not on file    Forced sexual activity: Not on file  Other Topics Concern  . Not on file  Social History Narrative  . Not on file     Current Outpatient Medications:  .  albuterol (PROVENTIL HFA) 108 (90 Base) MCG/ACT inhaler, Inhale 2 puffs into the lungs every 6 (six) hours as needed for wheezing or shortness of breath. (Patient taking differently: Inhale 2 puffs into the lungs every 6 (six) hours as needed for wheezing or shortness of  breath. ), Disp: 18 g, Rfl: 6 .  ALBUTEROL IN, Inhale into the lungs as needed., Disp: , Rfl:  .  Arnica 20 % TINC, Apply topically as needed. topical, Disp: , Rfl:  .  azelastine (ASTELIN) 0.1 % nasal spray, Place 1 spray into both nostrils 2 (two) times daily. Use in each nostril as directed, Disp: 30 mL, Rfl: 2 .  benazepril-hydrochlorthiazide (LOTENSIN HCT) 20-12.5 MG tablet, Take 1 tablet by mouth daily., Disp: 90 tablet, Rfl: 3 .  betamethasone valerate (VALISONE) 0.1 % cream, Apply 1 application topically as needed., Disp: , Rfl:  .  CALCIUM-MAG-VIT C-VIT D PO, Take by mouth. Liquid, uses off and on, Disp: , Rfl:  .  clobetasol ointment (TEMOVATE) 0.05 %, Apply 1 application topically 2 (two) times daily., Disp: 30 g, Rfl: 1 .  Fluticasone-Salmeterol (WIXELA INHUB) 250-50 MCG/DOSE AEPB, Inhale 1 puff into the lungs 2 (two) times daily., Disp: , Rfl:  .  Multiple Vitamin (MULTIVITAMIN) tablet, Take 1 tablet by mouth daily., Disp: , Rfl:  .  Nettle, Urtica Dioica, (NETTLE LEAF PO), Take 500 mg by mouth as needed., Disp: , Rfl:  .  predniSONE (DELTASONE) 20 MG tablet, 2 tabs for 5 days, 1 tabs for 5 days, and 1/2 tab for 3 days. Take tables together with breakfast., Disp: 20 tablet, Rfl: 0 .  sertraline (ZOLOFT) 100 MG tablet, Take 100 mg by mouth daily., Disp: , Rfl:  .  tiotropium (SPIRIVA HANDIHALER) 18 MCG inhalation capsule, Place 1 capsule (18 mcg total) into inhaler and inhale daily., Disp: 90 capsule, Rfl: 1 .  Triamcinolone Acetonide (TRIAMCINOLONE 0.1 % CREAM : EUCERIN) CREA, Apply 1 application topically 2 (two) times daily as needed for rash or itching., Disp: 1 each, Rfl: 0  EXAM:  VITALS per patient if applicable:N/A  GENERAL: alert, oriented, appears well and in no acute distress  HEENT: atraumatic, normocephalic conjunctiva clear, no facial obvious abnormalities on inspection.  LUNGS: on inspection no signs of respiratory distress, breathing rate appears normal, no obvious  gross SOB, gasping or wheezing  CV: no obvious cyanosis  Lorraine: moves all visible extremities without noticeable abnormality.  SKIN: Erythematous,scaly rash involving palms and elbows,bilateral.  PSYCH/NEURO: pleasant and cooperative, no obvious depression, she is very anxious.Speech and thought processing grossly intact.  ASSESSMENT AND PLAN:  Discussed the following assessment and plan:  Pruritic erythematous rash - Plan: clobetasol ointment (TEMOVATE) 0.05 %, predniSONE (DELTASONE) 20 MG tablet,  Recurrent. We reviewed possible etiologies, some discussed last visit.?  Psoriasis, ? Eczema. Strongly recommend arranging follow-up appointment with dermatologist. She would like to have a Kenalog injection, she  will come to the office this afternoon and received Kenalog 40 mg IM. Tomorrow she will start prednisone taper, which she tolerated well in the past. Also recommend topical steroid, clobetasol, on elbows and palms.  Strongly recommend avoiding application on face,dorsum or hands,neck,or skin folds. Side effects of chronic topical steroid use discussed as well as adverse effects of oral or injectable steroids.   25 min face to face OV. > 50% was dedicated to discussion of differential dx, prognosis, new treatment options depending of etiology, and some side effects of medications. Sarna OTC recommended. Avoid Benadryl.    I discussed the assessment and treatment plan with the patient.  She was provided an opportunity to ask questions and all were answered. The patient agreed with the plan and demonstrated an understanding of the instructions.   Return if symptoms worsen or fail to improve, for Recommend scheduling follow-up with her dermatologist..     [place], MD

## 2018-11-22 NOTE — Addendum Note (Signed)
Addended by: Zacarias Pontes on: 11/22/2018 03:18 PM   Modules accepted: Orders

## 2018-11-27 ENCOUNTER — Other Ambulatory Visit: Payer: Self-pay | Admitting: Family Medicine

## 2018-12-04 DIAGNOSIS — L309 Dermatitis, unspecified: Secondary | ICD-10-CM | POA: Diagnosis not present

## 2019-01-16 ENCOUNTER — Other Ambulatory Visit: Payer: Self-pay | Admitting: Family Medicine

## 2019-01-16 ENCOUNTER — Telehealth: Payer: Self-pay | Admitting: Family Medicine

## 2019-01-16 DIAGNOSIS — J449 Chronic obstructive pulmonary disease, unspecified: Secondary | ICD-10-CM

## 2019-01-16 NOTE — Telephone Encounter (Signed)
Medication Refill - Medication: Fluticasone-Salmeterol (WIXELA INHUB) 250-50 MCG/DOSE AEPB   tiotropium (SPIRIVA HANDIHALER)   Has the patient contacted their pharmacy? Yes.   (Agent: If no, request that the patient contact the pharmacy for the refill.) (Agent: If yes, when and what did the pharmacy advise?) a new Rx is needed   Preferred Pharmacy (with phone number or street name):  [facility] [place] [place] [facility] [phone] (Phone) [phone] (Fax)     Agent: Please be advised that RX refills may take up to 3 business days. We ask that you follow-up with your pharmacy.

## 2019-01-17 ENCOUNTER — Other Ambulatory Visit: Payer: Self-pay | Admitting: *Deleted

## 2019-01-17 DIAGNOSIS — J449 Chronic obstructive pulmonary disease, unspecified: Secondary | ICD-10-CM

## 2019-01-17 MED ORDER — SPIRIVA HANDIHALER 18 MCG IN CAPS: ORAL_CAPSULE | RESPIRATORY_TRACT | 1 refills | Status: DC

## 2019-01-17 MED ORDER — FLUTICASONE-SALMETEROL 250-50 MCG/DOSE IN AEPB: 1.0000 | INHALATION_SPRAY | Freq: Two times a day (BID) | RESPIRATORY_TRACT | 1 refills | Status: DC

## 2019-01-17 NOTE — Telephone Encounter (Signed)
Rx's sent to the pharmacy as requested. 

## 2019-01-17 NOTE — Telephone Encounter (Signed)
See request °

## 2019-03-09 ENCOUNTER — Other Ambulatory Visit: Payer: Self-pay | Admitting: Family Medicine

## 2019-04-02 DIAGNOSIS — Z23 Encounter for immunization: Secondary | ICD-10-CM | POA: Diagnosis not present

## 2019-05-01 ENCOUNTER — Other Ambulatory Visit: Payer: Self-pay | Admitting: Family Medicine

## 2019-06-19 ENCOUNTER — Other Ambulatory Visit: Payer: Self-pay | Admitting: Family Medicine

## 2019-06-19 DIAGNOSIS — I10 Essential (primary) hypertension: Secondary | ICD-10-CM

## 2019-06-20 ENCOUNTER — Other Ambulatory Visit: Payer: Self-pay | Admitting: Family Medicine

## 2019-06-28 ENCOUNTER — Other Ambulatory Visit: Payer: Self-pay | Admitting: Family Medicine

## 2019-06-28 DIAGNOSIS — J449 Chronic obstructive pulmonary disease, unspecified: Secondary | ICD-10-CM

## 2019-09-02 ENCOUNTER — Other Ambulatory Visit: Payer: Self-pay | Admitting: Family Medicine

## 2019-09-02 NOTE — Telephone Encounter (Signed)
LAST APPOINTMENT DATE: 11/22/2018 NEXT APPOINTMENT DATE:@Visit  date not found  Rx Astelin 0.21%  LAST REFILL:12/06/2018 QTY:30mg  2Rf

## 2019-09-22 DIAGNOSIS — J449 Chronic obstructive pulmonary disease, unspecified: Secondary | ICD-10-CM | POA: Diagnosis not present

## 2019-09-22 DIAGNOSIS — F17210 Nicotine dependence, cigarettes, uncomplicated: Secondary | ICD-10-CM | POA: Diagnosis not present

## 2019-09-22 DIAGNOSIS — R21 Rash and other nonspecific skin eruption: Secondary | ICD-10-CM | POA: Diagnosis not present

## 2019-09-22 DIAGNOSIS — J301 Allergic rhinitis due to pollen: Secondary | ICD-10-CM | POA: Diagnosis not present

## 2019-11-01 ENCOUNTER — Other Ambulatory Visit: Payer: Self-pay | Admitting: Family Medicine

## 2019-11-13 DIAGNOSIS — L309 Dermatitis, unspecified: Secondary | ICD-10-CM | POA: Diagnosis not present

## 2019-11-21 ENCOUNTER — Other Ambulatory Visit: Payer: Self-pay | Admitting: Family Medicine

## 2019-11-21 DIAGNOSIS — J449 Chronic obstructive pulmonary disease, unspecified: Secondary | ICD-10-CM

## 2019-11-21 DIAGNOSIS — I10 Essential (primary) hypertension: Secondary | ICD-10-CM

## 2019-12-08 ENCOUNTER — Encounter: Payer: Self-pay | Admitting: Family Medicine

## 2019-12-08 ENCOUNTER — Ambulatory Visit (INDEPENDENT_AMBULATORY_CARE_PROVIDER_SITE_OTHER): Payer: Medicare Other | Admitting: Family Medicine

## 2019-12-08 ENCOUNTER — Other Ambulatory Visit: Payer: Self-pay

## 2019-12-08 VITALS — BP 120/64 | HR 95 | Temp 98.4°F | Resp 16 | Ht 64.0 in | Wt 134.0 lb

## 2019-12-08 DIAGNOSIS — F324 Major depressive disorder, single episode, in partial remission: Secondary | ICD-10-CM | POA: Diagnosis not present

## 2019-12-08 DIAGNOSIS — I10 Essential (primary) hypertension: Secondary | ICD-10-CM

## 2019-12-08 DIAGNOSIS — E785 Hyperlipidemia, unspecified: Secondary | ICD-10-CM

## 2019-12-08 DIAGNOSIS — J449 Chronic obstructive pulmonary disease, unspecified: Secondary | ICD-10-CM | POA: Diagnosis not present

## 2019-12-08 DIAGNOSIS — L309 Dermatitis, unspecified: Secondary | ICD-10-CM | POA: Insufficient documentation

## 2019-12-08 DIAGNOSIS — F17200 Nicotine dependence, unspecified, uncomplicated: Secondary | ICD-10-CM

## 2019-12-08 MED ORDER — FLUTICASONE-SALMETEROL 250-50 MCG/DOSE IN AEPB: INHALATION_SPRAY | RESPIRATORY_TRACT | 2 refills | Status: DC

## 2019-12-08 MED ORDER — PREDNISONE 20 MG PO TABS: ORAL_TABLET | ORAL | 0 refills | Status: DC

## 2019-12-08 MED ORDER — SERTRALINE HCL 100 MG PO TABS: 100.0000 mg | ORAL_TABLET | Freq: Every day | ORAL | 3 refills | Status: DC

## 2019-12-08 MED ORDER — AZELASTINE HCL 0.1 % NA SOLN: 1.0000 | Freq: Two times a day (BID) | NASAL | 4 refills | Status: DC

## 2019-12-08 MED ORDER — DESONIDE 0.05 % EX OINT: 1.0000 "application " | TOPICAL_OINTMENT | Freq: Two times a day (BID) | CUTANEOUS | 1 refills | Status: DC

## 2019-12-08 MED ORDER — BENAZEPRIL-HYDROCHLOROTHIAZIDE 20-12.5 MG PO TABS: 1.0000 | ORAL_TABLET | Freq: Every day | ORAL | 1 refills | Status: DC

## 2019-12-08 NOTE — Assessment & Plan Note (Signed)
BP is adequately controlled. Continue benazepril-HCTZ 20-12.5 mg daily. We discussed some side effects of medication. We have not check BMP in over a year, we will arrange future labs (lab service is not available at this time). Continue low-salt diet. Instructed to monitor BP regularly.

## 2019-12-08 NOTE — Assessment & Plan Note (Signed)
Problem is still well controlled. Continue sertraline 100 mg daily.

## 2019-12-08 NOTE — Assessment & Plan Note (Signed)
She denied tested in pharmacologic treatment, so low-fat diet recommended for now. Further recommendation will be given according to lipid panel results.

## 2019-12-08 NOTE — Assessment & Plan Note (Signed)
Still symptomatic but improved after adding Spiriva. She does not want to change to a different LABA/ICS medication or make any other medication changes. We discussed other possible etiologies of dyspnea, including cardiac. She is not interested in further work-up. Instructed about warning signs.

## 2019-12-08 NOTE — Progress Notes (Signed)
HPI: [name] is a 74 y.o. female, who is here today for chronic disease management.  She was last seen on 11/22/2018 for acute visit.  Last follow-up on 08/06/2018.  Hypertension: Currently she is on benazepril-HCTZ 20-12.5 mg daily. Negative for severe/frequent headache, visual changes, focal weakness, or edema.  Lab Results  Component Value Date   CREATININE 0.59 08/06/2018   BUN 10 08/06/2018   NA 139 08/06/2018   K 4.6 08/06/2018   CL 100 08/06/2018   CO2 28 08/06/2018   COPD: Currently she is on Spiriva 1 inhaled capsule daily, Wixela 250-50 mcg bid, and albuterol inhaler 2 puff every 6 hours as needed. She denies frequent episodes of cough or wheezing. Exertional dyspnea, mainly when she walks up hills.  She has been walking her dog twice daily for the past year, problem seem to be stable. Alleviated by rest. She wonders if Lorraine Fitzgerald is really helping. She denies associated chest pain, palpitation, or diaphoresis.  Smoker for about 30-35 years. 1-2 PPD.  Hyperlipidemia: She is not on pharmacologic treatment. Lab Results  Component Value Date   CHOL 228 (H) 08/06/2018   HDL 89.70 08/06/2018   LDLCALC 120 (H) 08/06/2018   TRIG 89.0 08/06/2018   CHOLHDL 3 08/06/2018   Skin rash: This problem is chronic. Intermittent episodes of very pruritic erythematous rash, affecting mainly upper extremities and hands. She has had problem for years. She has seen dermatologist in the past, unknown etiology. She has tried betamethasone, triamcinolone, and clobetasol.  According the patient, 6 days ago she was treated for pruritic skin rash,. She received Depo-Medrol 80 mg IM, which she does not seen is helping. She is wondering how often she can get steroid injection. She has seen dermatology for similar problem, topical steroid she has used in the past have helped a little. She would like a refill of desonide ointment, which seems to be better.   Review of Systems    Constitutional: Positive for fatigue. Negative for activity change, appetite change and fever.  HENT: Negative for mouth sores, nosebleeds and sore throat.   Eyes: Negative for redness and visual disturbance.  Gastrointestinal: Negative for abdominal pain, nausea and vomiting.       Negative for changes in bowel habits.  Genitourinary: Negative for decreased urine volume, dysuria and hematuria.  Musculoskeletal: Positive for arthralgias. Negative for gait problem and myalgias.  Allergic/Immunologic: Positive for environmental allergies.  Neurological: Negative for syncope, facial asymmetry and weakness.  Psychiatric/Behavioral: Negative for confusion and hallucinations. The patient is nervous/anxious.   Rest of ROS, see pertinent positives sand negatives in HPI  Current Outpatient Medications on File Prior to Visit  Medication Sig Dispense Refill  . albuterol (PROVENTIL HFA) 108 (90 Base) MCG/ACT inhaler Inhale 2 puffs into the lungs every 6 (six) hours as needed for wheezing or shortness of breath. (Patient taking differently: Inhale 2 puffs into the lungs every 6 (six) hours as needed for wheezing or shortness of breath. ) 18 g 6  . ALBUTEROL IN Inhale into the lungs as needed.    . Arnica 20 % TINC Apply topically as needed. topical    . CALCIUM-MAG-VIT C-VIT D PO Take by mouth. Liquid, uses off and on    . Multiple Vitamin (MULTIVITAMIN) tablet Take 1 tablet by mouth daily.    . Nettle, Urtica Dioica, (NETTLE LEAF PO) Take 500 mg by mouth as needed.    Marland Kitchen SPIRIVA HANDIHALER 18 MCG inhalation capsule PLACE 1 CAPSULE  INTO       INHALER AND INHALE THE     CONTENTS DAILY 90 capsule 1   No current facility-administered medications on file prior to visit.     Past Medical History:  Diagnosis Date  . Alcohol abuse   . Allergy   . Arthritis   . Asthma   . Depression   . Emphysema of lung (HCC)   . History of chicken pox   . Hypertension   . Urine incontinence    No Known  Allergies  Social History   Socioeconomic History  . Marital status: Widowed    Spouse name: Not on file  . Number of children: 3  . Years of education: Not on file  . Highest education level: Not on file  Occupational History  . Not on file  Tobacco Use  . Smoking status: Current Every Day Smoker  . Smokeless tobacco: Never Used  Vaping Use  . Vaping Use: Never assessed  Substance and Sexual Activity  . Alcohol use: Not Currently  . Drug use: Never  . Sexual activity: Not Currently  Other Topics Concern  . Not on file  Social History Narrative  . Not on file   Social Determinants of Health   Financial Resource Strain:   . Difficulty of Paying Living Expenses:   Food Insecurity:   . Worried About Programme researcher, broadcasting/film/video in the Last Year:   . Barista in the Last Year:   Transportation Needs:   . Freight forwarder (Medical):   Marland Kitchen Lack of Transportation (Non-Medical):   Physical Activity:   . Days of Exercise per Week:   . Minutes of Exercise per Session:   Stress:   . Feeling of Stress :   Social Connections:   . Frequency of Communication with Friends and Family:   . Frequency of Social Gatherings with Friends and Family:   . Attends Religious Services:   . Active Member of Clubs or Organizations:   . Attends Banker Meetings:   Marland Kitchen Marital Status:     Vitals:   12/08/19 1638  BP: 120/64  Pulse: 95  Resp: 16  Temp: 98.4 F (36.9 C)  SpO2: 91%   Wt Readings from Last 3 Encounters:  12/08/19 134 lb (60.8 kg)  08/20/18 139 lb 2 oz (63.1 kg)  08/06/18 136 lb 6 oz (61.9 kg)   Body mass index is 23 kg/m.  Physical Exam Vitals and nursing note reviewed.  Constitutional:      General: She is not in acute distress.    Appearance: She is well-developed and normal weight.  HENT:     Head: Normocephalic and atraumatic.     Mouth/Throat:     Mouth: Mucous membranes are moist.     Pharynx: Oropharynx is clear.  Eyes:      Conjunctiva/sclera: Conjunctivae normal.     Pupils: Pupils are equal, round, and reactive to light.  Cardiovascular:     Rate and Rhythm: Normal rate and regular rhythm.     Heart sounds: No murmur heard.      Comments: PT pulses present bilateral. Pulmonary:     Effort: Pulmonary effort is normal. No respiratory distress.     Breath sounds: Normal breath sounds.  Abdominal:     Palpations: Abdomen is soft. There is no hepatomegaly or mass.     Tenderness: There is no abdominal tenderness.  Lymphadenopathy:     Cervical: No cervical adenopathy.  Skin:  General: Skin is warm.     Findings: Erythema and rash present.     Comments: Scattered rythematous maculopapular scaly areas on upper extremities and palms.   Neurological:     General: No focal deficit present.     Mental Status: She is alert and oriented to person, place, and time.     Cranial Nerves: No cranial nerve deficit.     Gait: Gait normal.  Psychiatric:        Mood and Affect: Affect normal. Mood is anxious.     Comments: Well groomed, good eye contact.     ASSESSMENT AND PLAN:   Ms. LIYANNA CARTWRIGHT was seen today for chronic disease management.  Orders Placed This Encounter  Procedures  . Basic metabolic panel  . Hepatic function panel  . Lipid panel    Chronic obstructive pulmonary disease (HCC) Still symptomatic but improved after adding Spiriva. She does not want to change to a different LABA/ICS medication or make any other medication changes. We discussed other possible etiologies of dyspnea, including cardiac. She is not interested in further work-up. Instructed about warning signs.  Depression, major, in partial remission ([place]) Problem is still well controlled. Continue sertraline 100 mg daily.  Hyperlipidemia She denied tested in pharmacologic treatment, so low-fat diet recommended for now. Further recommendation will be given according to lipid panel results.  Hypertension, essential,  benign BP is adequately controlled. Continue benazepril-HCTZ 20-12.5 mg daily. We discussed some side effects of medication. We have not check BMP in over a year, we will arrange future labs (lab service is not available at this time). Continue low-salt diet. Instructed to monitor BP regularly.  Eczema Not as severe as rash she had in 08/20/18 (we got pictures).. I do not feel comfortable giving her another injection of Depo-Medrol here in the office today. She would like to try another prednisone taper, which she has tolerated well in the past. We discussed side effects of steroids, including pathology fractures and avascular necrosis+ GI. She is not interested in going back to dermatologist.  After printing AVS she wanted to address skin rash and steroid injection. She did not want a new AVS printed, she added instruction to the one she received. 41 min face to face OV. > 50% was dedicated to discussion of Dx, prognosis, treatment options, and some side effects of medications.   Return in about 6 months (around 06/08/2020) for AWV. She needs fasting labs this week. .   Annsley Akkerman G. [place], MD  [facility] [place] office.

## 2019-12-08 NOTE — Patient Instructions (Addendum)
A few things to remember from today's visit:  Hyperlipidemia, unspecified hyperlipidemia type  Chronic obstructive pulmonary disease, unspecified COPD type (HCC), Chronic  Major depressive disorder in partial remission, unspecified whether recurrent (HCC), Chronic  Hypertension, essential, benign - Plan: Basic metabolic panel  No changes today. Will check cholesterol next visit.  If you need refills please call your pharmacy. Do not use My Chart to request refills or for acute issues that need immediate attention.    Please be sure medication list is accurate. If a new problem present, please set up appointment sooner than planned today.

## 2019-12-08 NOTE — Assessment & Plan Note (Signed)
We discussed adverse effects of tobacco use as well as benefits of smoking cessation. She does not feel like quitting at this time. We discussed current recommendations in regard to lung cancer screening, she meets criteria, she declined screening.

## 2019-12-08 NOTE — Assessment & Plan Note (Addendum)
Not as severe as rash she had in 08/20/18 (we got pictures).. I do not feel comfortable giving her another injection of Depo-Medrol here in the office today. She would like to try another prednisone taper, which she has tolerated well in the past. We discussed side effects of steroids, including pathology fractures and avascular necrosis+ GI. She is not interested in going back to dermatologist.

## 2019-12-09 ENCOUNTER — Telehealth: Payer: Self-pay | Admitting: Family Medicine

## 2019-12-09 NOTE — Telephone Encounter (Signed)
LVM to schedule for 6 month follow up( around 06/08/20) and fasting labs this week per [place]

## 2019-12-10 MED ORDER — PREDNISONE 20 MG PO TABS
ORAL_TABLET | ORAL | 0 refills | Status: DC
Start: 2019-12-10 — End: 2020-03-31

## 2019-12-10 MED ORDER — FLUTICASONE-SALMETEROL 250-50 MCG/DOSE IN AEPB: INHALATION_SPRAY | RESPIRATORY_TRACT | 2 refills | Status: DC

## 2019-12-16 ENCOUNTER — Other Ambulatory Visit: Payer: Self-pay

## 2019-12-16 ENCOUNTER — Ambulatory Visit (INDEPENDENT_AMBULATORY_CARE_PROVIDER_SITE_OTHER): Payer: Medicare Other

## 2019-12-16 DIAGNOSIS — Z0000 Encounter for general adult medical examination without abnormal findings: Secondary | ICD-10-CM | POA: Diagnosis not present

## 2019-12-16 NOTE — Patient Instructions (Signed)
[name] , Thank you for taking time to come for your Medicare Wellness Visit. I appreciate your ongoing commitment to your health goals. Please review the following plan we discussed and let me know if I can assist you in the future.   Screening recommendations/referrals: Colonoscopy: Patient declined Mammogram: Patient declined Bone Density: Patient declined  Recommended yearly ophthalmology/optometry visit for glaucoma screening and checkup Recommended yearly dental visit for hygiene and checkup  Vaccinations: Influenza vaccine: up to date, next due 01/2020 Pneumococcal vaccine: completed series per patient, will bring in copy of vaccine record Tdap vaccine: Patient declined, will check with pharmacy on cost. Shingles vaccine: Will check will pharmacy for cost has not had Shingrix, has had Zostavax  Advanced directives: Copy requested   Conditions/risks identified: None   Next appointment: None, Patient would like to schedule appointment for next year closer to time.    Preventive Care 31 Years and Older, Female Preventive care refers to lifestyle choices and visits with your health care provider that can promote health and wellness. What does preventive care include?  A yearly physical exam. This is also called an annual well check.  Dental exams once or twice a year.  Routine eye exams. Ask your health care provider how often you should have your eyes checked.  Personal lifestyle choices, including:  Daily care of your teeth and gums.  Regular physical activity.  Eating a healthy diet.  Avoiding tobacco and drug use.  Limiting alcohol use.  Practicing safe sex.  Taking low-dose aspirin every day.  Taking vitamin and mineral supplements as recommended by your health care provider. What happens during an annual well check? The services and screenings done by your health care provider during your annual well check will depend on your age, overall health, lifestyle  risk factors, and family history of disease. Counseling  Your health care provider may ask you questions about your:  Alcohol use.  Tobacco use.  Drug use.  Emotional well-being.  Home and relationship well-being.  Sexual activity.  Eating habits.  History of falls.  Memory and ability to understand (cognition).  Work and work Astronomer.  Reproductive health. Screening  You may have the following tests or measurements:  Height, weight, and BMI.  Blood pressure.  Lipid and cholesterol levels. These may be checked every 5 years, or more frequently if you are over 20 years old.  Skin check.  Lung cancer screening. You may have this screening every year starting at age 44 if you have a 30-pack-year history of smoking and currently smoke or have quit within the past 15 years.  Fecal occult blood test (FOBT) of the stool. You may have this test every year starting at age 92.  Flexible sigmoidoscopy or colonoscopy. You may have a sigmoidoscopy every 5 years or a colonoscopy every 10 years starting at age 88.  Hepatitis C blood test.  Hepatitis B blood test.  Sexually transmitted disease (STD) testing.  Diabetes screening. This is done by checking your blood sugar (glucose) after you have not eaten for a while (fasting). You may have this done every 1-3 years.  Bone density scan. This is done to screen for osteoporosis. You may have this done starting at age 34.  Mammogram. This may be done every 1-2 years. Talk to your health care provider about how often you should have regular mammograms. Talk with your health care provider about your test results, treatment options, and if necessary, the need for more tests. Vaccines  Your  health care provider may recommend certain vaccines, such as:  Influenza vaccine. This is recommended every year.  Tetanus, diphtheria, and acellular pertussis (Tdap, Td) vaccine. You may need a Td booster every 10 years.  Zoster vaccine.  You may need this after age 72.  Pneumococcal 13-valent conjugate (PCV13) vaccine. One dose is recommended after age 73.  Pneumococcal polysaccharide (PPSV23) vaccine. One dose is recommended after age 69. Talk to your health care provider about which screenings and vaccines you need and how often you need them. This information is not intended to replace advice given to you by your health care provider. Make sure you discuss any questions you have with your health care provider. Document Released: 06/25/2015 Document Revised: 02/16/2016 Document Reviewed: 03/30/2015 Elsevier Interactive Patient Education  2017 [place] Prevention in the Home Falls can cause injuries. They can happen to people of all ages. There are many things you can do to make your home safe and to help prevent falls. What can I do on the outside of my home?  Regularly fix the edges of walkways and driveways and fix any cracks.  Remove anything that might make you trip as you walk through a door, such as a raised step or threshold.  Trim any bushes or trees on the path to your home.  Use bright outdoor lighting.  Clear any walking paths of anything that might make someone trip, such as rocks or tools.  Regularly check to see if handrails are loose or broken. Make sure that both sides of any steps have handrails.  Any raised decks and porches should have guardrails on the edges.  Have any leaves, snow, or ice cleared regularly.  Use sand or salt on walking paths during winter.  Clean up any spills in your garage right away. This includes oil or grease spills. What can I do in the bathroom?  Use night lights.  Install grab bars by the toilet and in the tub and shower. Do not use towel bars as grab bars.  Use non-skid mats or decals in the tub or shower.  If you need to sit down in the shower, use a plastic, non-slip stool.  Keep the floor dry. Clean up any water that spills on the floor as soon  as it happens.  Remove soap buildup in the tub or shower regularly.  Attach bath mats securely with double-sided non-slip rug tape.  Do not have throw rugs and other things on the floor that can make you trip. What can I do in the bedroom?  Use night lights.  Make sure that you have a light by your bed that is easy to reach.  Do not use any sheets or blankets that are too big for your bed. They should not hang down onto the floor.  Have a firm chair that has side arms. You can use this for support while you get dressed.  Do not have throw rugs and other things on the floor that can make you trip. What can I do in the kitchen?  Clean up any spills right away.  Avoid walking on wet floors.  Keep items that you use a lot in easy-to-reach places.  If you need to reach something above you, use a strong step stool that has a grab bar.  Keep electrical cords out of the way.  Do not use floor polish or wax that makes floors slippery. If you must use wax, use non-skid floor wax.  Do not  have throw rugs and other things on the floor that can make you trip. What can I do with my stairs?  Do not leave any items on the stairs.  Make sure that there are handrails on both sides of the stairs and use them. Fix handrails that are broken or loose. Make sure that handrails are as long as the stairways.  Check any carpeting to make sure that it is firmly attached to the stairs. Fix any carpet that is loose or worn.  Avoid having throw rugs at the top or bottom of the stairs. If you do have throw rugs, attach them to the floor with carpet tape.  Make sure that you have a light switch at the top of the stairs and the bottom of the stairs. If you do not have them, ask someone to add them for you. What else can I do to help prevent falls?  Wear shoes that:  Do not have high heels.  Have rubber bottoms.  Are comfortable and fit you well.  Are closed at the toe. Do not wear sandals.  If  you use a stepladder:  Make sure that it is fully opened. Do not climb a closed stepladder.  Make sure that both sides of the stepladder are locked into place.  Ask someone to hold it for you, if possible.  Clearly mark and make sure that you can see:  Any grab bars or handrails.  First and last steps.  Where the edge of each step is.  Use tools that help you move around (mobility aids) if they are needed. These include:  Canes.  Walkers.  Scooters.  Crutches.  Turn on the lights when you go into a dark area. Replace any light bulbs as soon as they burn out.  Set up your furniture so you have a clear path. Avoid moving your furniture around.  If any of your floors are uneven, fix them.  If there are any pets around you, be aware of where they are.  Review your medicines with your doctor. Some medicines can make you feel dizzy. This can increase your chance of falling. Ask your doctor what other things that you can do to help prevent falls. This information is not intended to replace advice given to you by your health care provider. Make sure you discuss any questions you have with your health care provider. Document Released: 03/25/2009 Document Revised: 11/04/2015 Document Reviewed: 07/03/2014 Elsevier Interactive Patient Education  2017 [employer].

## 2019-12-16 NOTE — Progress Notes (Addendum)
Subjective:   Lorraine Fitzgerald is a 74 y.o. female who presents for Medicare Annual (Subsequent) preventive examination. I connected with Lorraine Fitzgerald today by telephone and verified that I am speaking with the correct person using two identifiers. Location patient: home Location provider: work Persons participating in the virtual visit: patient, provider.   I discussed the limitations, risks, security and privacy concerns of performing an evaluation and management service by telephone and the availability of in person appointments. I also discussed with the patient that there may be a patient responsible charge related to this service. The patient expressed understanding and verbally consented to this telephonic visit.    Interactive audio and video telecommunications were attempted between this provider and patient, however failed, due to patient having technical difficulties OR patient did not have access to video capability.  We continued and completed visit with audio only.     Review of Systems    N/A Cardiac Risk Factors include: advanced age (>69men, >21 women)     Objective:    Today's Vitals   There is no height or weight on file to calculate BMI.  Advanced Directives 12/16/2019  Does Patient Have a Medical Advance Directive? Yes  Type of Estate agent of [place];Living will  Does patient want to make changes to medical advance directive? No - Patient declined  Copy of Healthcare Power of Attorney in Chart? No - copy requested    Current Medications (verified) Outpatient Encounter Medications as of 12/16/2019  Medication Sig  . albuterol (PROVENTIL HFA) 108 (90 Base) MCG/ACT inhaler Inhale 2 puffs into the lungs every 6 (six) hours as needed for wheezing or shortness of breath. (Patient taking differently: Inhale 2 puffs into the lungs every 6 (six) hours as needed for wheezing or shortness of breath. )  . ALBUTEROL IN Inhale into the lungs as needed.    . Arnica 20 % TINC Apply topically as needed. topical  . azelastine (ASTELIN) 0.1 % nasal spray Place 1 spray into both nostrils 2 (two) times daily. Use in each nostril as directed  . benazepril-hydrochlorthiazide (LOTENSIN HCT) 20-12.5 MG tablet Take 1 tablet by mouth daily.  Marland Kitchen CALCIUM-MAG-VIT C-VIT D PO Take by mouth. Liquid, uses off and on  . desonide (DESOWEN) 0.05 % ointment Apply 1 application topically 2 (two) times daily. For up to 2 weeks.  . Fluticasone-Salmeterol (WIXELA INHUB) 250-50 MCG/DOSE AEPB USE 1 INHALATION ORALLY    TWICE DAILY  . Multiple Vitamin (MULTIVITAMIN) tablet Take 1 tablet by mouth daily.  . Nettle, Urtica Dioica, (NETTLE LEAF PO) Take 500 mg by mouth as needed.  . predniSONE (DELTASONE) 20 MG tablet 2 tabs for 5 days, 1 tabs for 5 days, then 1/2 tan x 3 days.Take tables together with breakfast.  . sertraline (ZOLOFT) 100 MG tablet Take 1 tablet (100 mg total) by mouth daily.  Marland Kitchen SPIRIVA HANDIHALER 18 MCG inhalation capsule PLACE 1 CAPSULE INTO       INHALER AND INHALE THE     CONTENTS DAILY   No facility-administered encounter medications on file as of 12/16/2019.    Allergies (verified) Patient has no known allergies.   History: Past Medical History:  Diagnosis Date  . Alcohol abuse   . Allergy   . Arthritis   . Asthma   . Depression   . Emphysema of lung (HCC)   . History of chicken pox   . Hypertension   . Urine incontinence    Past Surgical History:  Procedure Laterality Date  . TUBAL LIGATION  1985   Family History  Problem Relation Age of Onset  . COPD Mother   . Early death Mother   . Miscarriages / [place]   . Rheumatic fever Mother   . Hearing loss Father   . Alcohol abuse Brother   . Cancer Brother   . Drug abuse Brother   . Heart disease Brother   . Alcohol abuse Son   . Arthritis Son   . Drug abuse Son   . Learning disabilities Son    Social History   Socioeconomic History  . Marital status: Widowed     Spouse name: Not on file  . Number of children: 3  . Years of education: Not on file  . Highest education level: Not on file  Occupational History  . Not on file  Tobacco Use  . Smoking status: Current Every Day Smoker    Types: Cigarettes  . Smokeless tobacco: Never Used  Vaping Use  . Vaping Use: Never assessed  Substance and Sexual Activity  . Alcohol use: Not Currently  . Drug use: Never  . Sexual activity: Not Currently  Other Topics Concern  . Not on file  Social History Narrative  . Not on file   Social Determinants of Health   Financial Resource Strain: Low Risk   . Difficulty of Paying Living Expenses: Not hard at all  Food Insecurity: No Food Insecurity  . Worried About Programme researcher, broadcasting/film/videounning Out of Food in the Last Year: Never true  . Ran Out of Food in the Last Year: Never true  Transportation Needs: No Transportation Needs  . Lack of Transportation (Medical): No  . Lack of Transportation (Non-Medical): No  Physical Activity: Sufficiently Active  . Days of Exercise per Week: 7 days  . Minutes of Exercise per Session: 30 min  Stress: No Stress Concern Present  . Feeling of Stress : Not at all  Social Connections: Moderately Isolated  . Frequency of Communication with Friends and Family: More than three times a week  . Frequency of Social Gatherings with Friends and Family: Once a week  . Attends Religious Services: Never  . Active Member of Clubs or Organizations: Yes  . Attends BankerClub or Organization Meetings: 1 to 4 times per year  . Marital Status: Widowed    Tobacco Counseling Ready to quit: No Counseling given: Not Answered   Clinical Intake:  Pre-visit preparation completed: Yes  Pain : No/denies pain     Nutritional Risks: None Diabetes: No  How often do you need to have someone help you when you read instructions, pamphlets, or other written materials from your doctor or pharmacy?: 1 - Never What is the last grade level you completed in school?: Some  college  Diabetic?No  Interpreter Needed?: No  Information entered by :: SCrews,LPN   Activities of Daily Living In your present state of health, do you have any difficulty performing the following activities: 12/16/2019 12/08/2019  Hearing? N N  Vision? N N  Difficulty concentrating or making decisions? N N  Walking or climbing stairs? N N  Dressing or bathing? N N  Doing errands, shopping? N N  Preparing Food and eating ? N -  Using the Toilet? N -  In the past six months, have you accidently leaked urine? N -  Do you have problems with loss of bowel control? N -  Managing your Medications? N -  Managing your Finances? N -  Housekeeping or  managing your Housekeeping? N -  Some recent data might be hidden    Patient Care Team: [place], Betty G, MD as PCP - General (Family Medicine)  Indicate any recent Medical Services you may have received from other than [facility] providers in the past year (date may be approximate).     Assessment:   This is a routine wellness examination for Heydi.  Hearing/Vision screen  Hearing Screening   125Hz  250Hz  500Hz  1000Hz  2000Hz  3000Hz  4000Hz  6000Hz  8000Hz   Right ear:           Left ear:           Vision Screening Comments: Gets yearly eye exam  Dietary issues and exercise activities discussed: Current Exercise Habits: Home exercise routine (Patient walks her dog 3x per day for greater than 30 minutes), Type of exercise: walking, Time (Minutes): 30, Frequency (Times/Week): 7, Weekly Exercise (Minutes/Week): 210, Intensity: Moderate, Exercise limited by: None identified  Goals    . DIET - INCREASE WATER INTAKE     I will increase my water intake to 6-8 cups of water per day    . Patient Stated     I will continue to walk 3x per day.      Depression Screen PHQ 2/9 Scores 12/16/2019 08/06/2018  PHQ - 2 Score 0 0  PHQ- 9 Score 0 -    Fall Risk Fall Risk  12/16/2019 08/06/2018  Falls in the past year? 0 0  Number falls in past yr: 0 0   Injury with Fall? 0 0  Risk for fall due to : Medication side effect -  Follow up Falls evaluation completed;Follow up appointment Education provided    Any stairs in or around the home? No  If so, are there any without handrails? No  Home free of loose throw rugs in walkways, pet beds, electrical cords, etc? No  Adequate lighting in your home to reduce risk of falls? Yes   ASSISTIVE DEVICES UTILIZED TO PREVENT FALLS:  Life alert? No  Use of a cane, walker or w/c? No  Grab bars in the bathroom? Yes  Shower chair or bench in shower? No  Elevated toilet seat or a handicapped toilet? No        Cognitive Function:     6CIT Screen 12/16/2019  What Year? 0 points  What month? 0 points  What time? 0 points  Count back from 20 0 points  Months in reverse 0 points  Repeat phrase 0 points  Total Score 0    Immunizations Immunization History  Administered Date(s) Administered  . Influenza, High Dose Seasonal PF 03/05/2018, 04/01/2019  . PFIZER SARS-COV-2 Vaccination 07/14/2019, 08/05/2019    TDAP status: Due, Education has been provided regarding the importance of this vaccine. Advised may receive this vaccine at local pharmacy or Health Dept. Aware to provide a copy of the vaccination record if obtained from local pharmacy or Health Dept. Verbalized acceptance and understanding. Flu Vaccine status: Up to date Pneumococcal vaccine status: Up to date Covid-19 vaccine status: Completed vaccines  Qualifies for Shingles Vaccine? Yes   Zostavax completed Yes   Shingrix Completed?: No.    Education has been provided regarding the importance of this vaccine. Patient has been advised to call insurance company to determine out of pocket expense if they have not yet received this vaccine. Advised may also receive vaccine at local pharmacy or Health Dept. Verbalized acceptance and understanding.  Screening Tests Health Maintenance  Topic Date Due  . INFLUENZA  VACCINE  01/11/2020  .  COVID-19 Vaccine  Completed  . Hepatitis C Screening  Completed  . MAMMOGRAM  Discontinued  . DEXA SCAN  Discontinued  . COLONOSCOPY  Discontinued  . TETANUS/TDAP  Discontinued  . PNA vac Low Risk Adult  Discontinued    Health Maintenance  There are no preventive care reminders to display for this patient.  Colorectal screening: Patient refused  Mammogram status: No longer required. Patient refused states does not get this done. Bone Density Status: Patient refused to have this done.  Lung Cancer Screening: (Low Dose CT Chest recommended if Age 33-80 years, 30 pack-year currently smoking OR have quit w/in 15years.) does qualify.   Lung Cancer Screening Referral: No  Additional Screening:  Hepatitis C Screening: does qualify; Completed 08/06/2018  Vision Screening: Recommended annual ophthalmology exams for early detection of glaucoma and other disorders of the eye. Is the patient up to date with their annual eye exam?  No  Who is the provider or what is the name of the office in which the patient attends annual eye exams? Patient moved here right before COVID-19 and has not established with a new eye doctor. States that she will find an eye doctor and get an eye exam this year. If pt is not established with a provider, would they like to be referred to a provider to establish care? No .   Dental Screening: Recommended annual dental exams for proper oral hygiene  Community Resource Referral / Chronic Care Management: CRR required this visit?  No   CCM required this visit?  No      Plan:     I have personally reviewed and noted the following in the patient's chart:   . Medical and social history . Use of alcohol, tobacco or illicit drugs  . Current medications and supplements . Functional ability and status . Nutritional status . Physical activity . Advanced directives . List of other physicians . Hospitalizations, surgeries, and ER visits in previous 12  months . Vitals . Screenings to include cognitive, depression, and falls . Referrals and appointments  In addition, I have reviewed and discussed with patient certain preventive protocols, quality metrics, and best practice recommendations. A written personalized care plan for preventive services as well as general preventive health recommendations were provided to patient.     Theodora Blow, LPN   9/43/2761   Nurse Notes: Patient has declined mammogram,Bone Density and colonoscopy states that she never gets this done. She will bring in her records showing her Zostavax as well as both pneumococcal vaccines.

## 2020-01-04 ENCOUNTER — Other Ambulatory Visit: Payer: Self-pay | Admitting: Family Medicine

## 2020-01-04 DIAGNOSIS — J449 Chronic obstructive pulmonary disease, unspecified: Secondary | ICD-10-CM

## 2020-01-10 ENCOUNTER — Other Ambulatory Visit: Payer: Self-pay | Admitting: Family Medicine

## 2020-02-02 DIAGNOSIS — L309 Dermatitis, unspecified: Secondary | ICD-10-CM | POA: Diagnosis not present

## 2020-02-07 ENCOUNTER — Other Ambulatory Visit: Payer: Self-pay | Admitting: Family Medicine

## 2020-02-23 ENCOUNTER — Telehealth: Payer: Self-pay | Admitting: Family Medicine

## 2020-02-23 DIAGNOSIS — Z0189 Encounter for other specified special examinations: Secondary | ICD-10-CM

## 2020-02-23 NOTE — Telephone Encounter (Signed)
Pt stated she would like a referral sent to rheumatology for the skin rash on her hands that PCP is aware about. She stated she also has arthritis in that thumb and read that it could be related.   Pt can be reached at [phone]

## 2020-02-23 NOTE — Telephone Encounter (Signed)
Okay to place referral

## 2020-02-27 NOTE — Telephone Encounter (Signed)
I do not think rheumatologist will see her for a rash but may agree to evaluate for joint pain. It is okay to place referral, diagnosis "pt requested." Thanks, BJ

## 2020-02-27 NOTE — Addendum Note (Signed)
Addended by: Kathreen Devoid on: 02/27/2020 02:32 PM   Modules accepted: Orders

## 2020-02-27 NOTE — Telephone Encounter (Signed)
Referral placed.

## 2020-03-31 ENCOUNTER — Ambulatory Visit (INDEPENDENT_AMBULATORY_CARE_PROVIDER_SITE_OTHER): Payer: Medicare Other | Admitting: Internal Medicine

## 2020-03-31 ENCOUNTER — Other Ambulatory Visit: Payer: Self-pay

## 2020-03-31 ENCOUNTER — Encounter: Payer: Self-pay | Admitting: Internal Medicine

## 2020-03-31 VITALS — BP 109/73 | HR 88 | Ht 63.5 in | Wt 134.0 lb

## 2020-03-31 DIAGNOSIS — L309 Dermatitis, unspecified: Secondary | ICD-10-CM

## 2020-03-31 DIAGNOSIS — M159 Polyosteoarthritis, unspecified: Secondary | ICD-10-CM | POA: Insufficient documentation

## 2020-03-31 DIAGNOSIS — R5383 Other fatigue: Secondary | ICD-10-CM | POA: Insufficient documentation

## 2020-03-31 MED ORDER — PREDNISONE 5 MG PO TABS: 5.0000 mg | ORAL_TABLET | Freq: Every day | ORAL | 0 refills | Status: DC

## 2020-03-31 NOTE — Progress Notes (Signed)
Office Visit Note  Patient: Lorraine Fitzgerald             Date of Birth: Sep 06, 1945           MRN: [id number]             PCP: [place], Betty G, MD Referring: [place], Betty G, MD Visit Date: 03/31/2020   Subjective:   History of Present Illness: Lorraine Fitzgerald is a 74 y.o. female here for evaluation of joint pain and rashes.  She has some diffuse arthralgias chronically but her primary complaint today is of recurring skin rashes.  The skin rashes are worst over her upper extremities and hands but also occur throughout.  She states these have been seen by many doctors in the past but attributed to eczema and dyshidrosis.  She is treated with a variety of therapies including emollients and topical corticosteroids without good resolution.  She states the only thing that fully resolves the skin rash is intermittent doses of corticosteroids that she will get IM at doctor's visits which clears her skin but then it returns to a diffuse rash afterwards. She also experiences arthralgias in many joints but has previously assumed this due to osteoarthritis. She denies history of alopecia, oral ulcers, lymphadenopathy, raynaud's, blood clots. She does experience dry eyes uses lubricating drops for this no history of uveitis or iritis.   Activities of Daily Living:  Patient reports morning stiffness for 0 minutes.   Patient Denies nocturnal pain.  Difficulty dressing/grooming: Denies Difficulty climbing stairs: Denies Difficulty getting out of chair: Denies Difficulty using hands for taps, buttons, cutlery, and/or writing: Reports  Review of Systems  Constitutional: Negative for fatigue.  HENT: Negative for mouth sores, mouth dryness and nose dryness.   Eyes: Positive for dryness. Negative for pain, itching and visual disturbance.  Respiratory: Positive for cough. Negative for hemoptysis, shortness of breath and difficulty breathing.        Patient complains of cough due to smoking.   Cardiovascular:  Negative for chest pain, palpitations and swelling in legs/feet.  Gastrointestinal: Negative for abdominal pain, blood in stool, constipation and diarrhea.  Endocrine: Negative for increased urination.  Genitourinary: Negative for painful urination.  Musculoskeletal: Positive for arthralgias and joint pain. Negative for joint swelling, myalgias, muscle weakness, morning stiffness, muscle tenderness and myalgias.  Skin: Positive for rash and redness. Negative for color change.  Allergic/Immunologic: Negative for susceptible to infections.  Neurological: Negative for dizziness, numbness, headaches, memory loss and weakness.  Hematological: Negative for swollen glands.  Psychiatric/Behavioral: Negative for confusion and sleep disturbance.    PMFS History:  Patient Active Problem List   Diagnosis Date Noted  . Generalized osteoarthritis 03/31/2020  . Other fatigue 03/31/2020  . Eczema 12/08/2019  . Hyperlipidemia 08/06/2018  . Tobacco use disorder 06/01/2018  . Hypertension, essential, benign 06/01/2018  . Chronic obstructive pulmonary disease (HCC) 06/01/2018  . Depression, major, in partial remission (HCC) 05/28/2018    Past Medical History:  Diagnosis Date  . Alcohol abuse   . Allergy   . Arthritis   . Asthma   . Depression   . Emphysema of lung (HCC)   . History of chicken pox   . Hypertension   . Urine incontinence     Family History  Problem Relation Age of Onset  . COPD Mother   . Early death Mother   . Miscarriages / [place] Mother   . Rheumatic fever Mother   . Arthritis Mother   . Hearing  loss Father   . Alcohol abuse Brother   . Cancer Brother   . Heart disease Brother   . Congestive Heart Failure Daughter   . Alcohol abuse Son   . Arthritis Son   . Drug abuse Son   . Learning disabilities Son    Past Surgical History:  Procedure Laterality Date  . TUBAL LIGATION  1985   Social History   Social History Narrative  . Not on file   Immunization  History  Administered Date(s) Administered  . Influenza, High Dose Seasonal PF 03/05/2018, 04/01/2019  . PFIZER SARS-COV-2 Vaccination 07/14/2019, 08/05/2019     Objective: Vital Signs: BP 109/73 (BP Location: Left Arm, Patient Position: Sitting, Cuff Size: Small)   Pulse 88   Ht 5' 3.5" (1.613 m)   Wt 134 lb (60.8 kg)   BMI 23.36 kg/m    Physical Exam HENT:     Right Ear: External ear normal.     Left Ear: External ear normal.     Mouth/Throat:     Mouth: Mucous membranes are moist.     Pharynx: Oropharynx is clear.  Eyes:     Conjunctiva/sclera: Conjunctivae normal.     Pupils: Pupils are equal, round, and reactive to light.  Skin:    General: Skin is warm and dry.     Comments: erythermatous rash over both arms increased on extensor surfaces, no scaling, skin cracked in several places, healing breaks in skin of both palms over thenar emminence  Neurological:     Mental Status: She is alert.      Musculoskeletal Exam: Neck full ROM no tenderness Shoulders, elbows, full ROM no tenderness Squaring of 1st CMC joint without much tenderness to palpation, heberdon's nodes present at DIP joints, good ROM and strength, no swelling Knees full ROM no tenderness or swelling Feet no tenderness or swelling  CDAI Exam: CDAI Score: -- Patient Global: --; Provider Global: -- Swollen: --; Tender: -- Joint Exam 03/31/2020   No joint exam has been documented for this visit   There is currently no information documented on the homunculus. Go to the Rheumatology activity and complete the homunculus joint exam.  Investigation: No additional findings.  Imaging: No results found.  Recent Labs: Lab Results  Component Value Date   WBC 5.5 08/06/2018   HGB 15.3 (H) 08/06/2018   PLT 247.0 08/06/2018   NA 139 08/06/2018   K 4.6 08/06/2018   CL 100 08/06/2018   CO2 28 08/06/2018   GLUCOSE 82 08/06/2018   BUN 10 08/06/2018   CREATININE 0.59 08/06/2018   CALCIUM 9.8 08/06/2018     Speciality Comments: No specialty comments available.  Procedures:  No procedures performed Allergies: Patient has no known allergies.   Assessment / Plan:     Visit Diagnoses: Generalized osteoarthritis - Plan: ANA, Anti-DNA antibody, double-stranded, Anti-Smith antibody, Ambulatory referral to Occupational Therapy  Her arthralgia seems most likely explained by osteoarthritis changes which are present on physical exam. These often do improve with corticosteroid exposure but would not treat long term for this. [facility] joint arthritis can sometimes benefit with OT treatments or device so will refer today for this. Skin rash not typical for SLE or vasculitis but will check antibodies considering her complaints of arthralgia, fatigue, skin rash, xerophthalmia. Unless high titer ANA or specific ENAs positive would not order further workup for systemic disease at this time.  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 Control is usually with topical therapy she has been prescribed numerous over previous encounters recommended she resume use of topical clobetasol on highly active areas. She also describes a pattern sounds like rebounding after IM high dose steroid shots. I recommended we use a lower dose daily prednisone and see if tapering off will be tolerated. Ultimately I believe dermatology input will be needed again given her complaints possibly consider advanced treatment such as cyclosporine or dupilumab because she has had such complaint despite topical treatments..   Orders: Orders Placed This Encounter  Procedures  . ANA  . Anti-DNA antibody, double-stranded  . Anti-Smith antibody  . Anti-nuclear ab-titer (ANA titer)  . Ambulatory referral to Occupational Therapy   Meds ordered this encounter  Medications   . predniSONE (DELTASONE) 5 MG tablet    Sig: Take 1 tablet (5 mg total) by mouth daily with breakfast.    Dispense:  30 tablet    Refill:  0     Follow-Up Instructions: Return in about 4 weeks (around 04/28/2020) for Rashes.   Fuller Plan, MD  Note - This record has been created using [employer].  Chart creation errors have been sought, but may not always  have been located. Such creation errors do not reflect on  the standard of medical care.

## 2020-04-04 LAB — ANTI-NUCLEAR AB-TITER (ANA TITER): ANA Titer 1: 1:40 {titer} — ABNORMAL HIGH

## 2020-04-04 LAB — ANTI-SMITH ANTIBODY: ENA SM Ab Ser-aCnc: <1 AI

## 2020-04-04 LAB — ANA: Anti Nuclear Antibody (ANA): POSITIVE — AB

## 2020-04-04 LAB — ANTI-DNA ANTIBODY, DOUBLE-STRANDED: ds DNA Ab: 1 IU/mL

## 2020-04-05 ENCOUNTER — Telehealth: Payer: Self-pay | Admitting: Internal Medicine

## 2020-04-05 NOTE — Progress Notes (Signed)
[name] antibody tests are not concerning for lupus, and I do not recommend any additional testing for systemic inflammatory disease at this time. We will follow up as planned to see how symptoms are responding.

## 2020-04-05 NOTE — Telephone Encounter (Signed)
Spoke with patient - she would like to know if she can receive the Booster while on the Prednisone she recently started or if she should wait until she is off the medication. Please advise.

## 2020-04-05 NOTE — Telephone Encounter (Signed)
You recently started patient on Prednisone. Patient would like to know what doctor thinks about the Booster related to the medication she is already on. Please call to discuss.

## 2020-04-05 NOTE — Telephone Encounter (Signed)
Prednisone at the low dose she is taking should not significantly effect the safety or the efficacy of COVID vaccination. She can get the booster dose with no changes needed to the medication.

## 2020-04-06 NOTE — Telephone Encounter (Signed)
LVM for patient explaining Prednisone at the low dose she is taking should not significantly effect the safety or the efficacy of COVID vaccination. She can get the booster dose with no changes needed to the medication.

## 2020-04-28 ENCOUNTER — Other Ambulatory Visit: Payer: Self-pay

## 2020-04-28 ENCOUNTER — Ambulatory Visit (INDEPENDENT_AMBULATORY_CARE_PROVIDER_SITE_OTHER): Payer: Medicare Other | Admitting: Internal Medicine

## 2020-04-28 ENCOUNTER — Encounter: Payer: Self-pay | Admitting: Internal Medicine

## 2020-04-28 VITALS — BP 111/72 | HR 80 | Ht 64.0 in | Wt 131.6 lb

## 2020-04-28 DIAGNOSIS — M159 Polyosteoarthritis, unspecified: Secondary | ICD-10-CM | POA: Diagnosis not present

## 2020-04-28 DIAGNOSIS — R5383 Other fatigue: Secondary | ICD-10-CM | POA: Diagnosis not present

## 2020-04-28 DIAGNOSIS — Z79899 Other long term (current) drug therapy: Secondary | ICD-10-CM | POA: Diagnosis not present

## 2020-04-28 DIAGNOSIS — L309 Dermatitis, unspecified: Secondary | ICD-10-CM

## 2020-04-28 MED ORDER — PREDNISONE 5 MG PO TABS: 5.0000 mg | ORAL_TABLET | Freq: Every day | ORAL | 0 refills | Status: DC

## 2020-04-28 NOTE — Progress Notes (Signed)
Office Visit Note  Patient: Lorraine Fitzgerald             Date of Birth: [birth year]           MRN: [id number]             PCP: [place], Betty G, MD Referring: [place], Betty G, MD Visit Date: 04/28/2020   Subjective:   History of Present Illness: Lorraine Fitzgerald is a 74 y.o. female for diffuse eczematous skin rashes and generalized osteoarthritis we treated with a trial of low dose prednisone. She reports feeling very well since starting this medication with improvement in hand stiffness and pain, improvement in skin rash and pruritis, and improvement in fatigue. Near complete improvement of skin lesions over forearms and palms. She does still have arthritis symptoms in both hands especially the thumbs and is interested in following up about OT evaluation. She also has several questions about recommendations for vaccines. She has not experienced any episode of diffuse symptoms like past erythrodermic rash.   Review of Systems  Constitutional: Negative for fatigue.  HENT: Negative for mouth sores, mouth dryness and nose dryness.   Eyes: Positive for dryness. Negative for pain, itching and visual disturbance.  Respiratory: Negative for cough, hemoptysis, shortness of breath and difficulty breathing.   Cardiovascular: Negative for chest pain, palpitations and swelling in legs/feet.  Gastrointestinal: Negative for abdominal pain, blood in stool, constipation and diarrhea.  Endocrine: Negative for increased urination.  Genitourinary: Negative for painful urination.  Musculoskeletal: Positive for arthralgias, joint pain, joint swelling and muscle weakness. Negative for myalgias, morning stiffness, muscle tenderness and myalgias.  Skin: Negative for color change, rash and redness.  Allergic/Immunologic: Negative for susceptible to infections.  Neurological: Negative for dizziness, numbness, headaches, memory loss and weakness.  Hematological: Negative for swollen glands.  Psychiatric/Behavioral:  Negative for confusion and sleep disturbance.    PMFS History:  Patient Active Problem List   Diagnosis Date Noted  . Long-term use of high-risk medication 04/29/2020  . Generalized osteoarthritis 03/31/2020  . Other fatigue 03/31/2020  . Eczema 12/08/2019  . Hyperlipidemia 08/06/2018  . Tobacco use disorder 06/01/2018  . Hypertension, essential, benign 06/01/2018  . Chronic obstructive pulmonary disease (HCC) 06/01/2018  . Depression, major, in partial remission (HCC) 05/28/2018    Past Medical History:  Diagnosis Date  . Alcohol abuse   . Allergy   . Arthritis   . Asthma   . Depression   . Emphysema of lung (HCC)   . History of chicken pox   . Hypertension   . Urine incontinence     Family History  Problem Relation Age of Onset  . COPD Mother   . Early death Mother   . Miscarriages / [place] Mother   . Rheumatic fever Mother   . Arthritis Mother   . Hearing loss Father   . Alcohol abuse Brother   . Cancer Brother   . Heart disease Brother   . Congestive Heart Failure Daughter   . Alcohol abuse Son   . Arthritis Son   . Drug abuse Son   . Learning disabilities Son    Past Surgical History:  Procedure Laterality Date  . TUBAL LIGATION  1985   Social History   Social History Narrative  . Not on file   Immunization History  Administered Date(s) Administered  . Influenza, High Dose Seasonal PF 03/05/2018, 04/01/2019  . PFIZER SARS-COV-2 Vaccination 07/14/2019, 08/05/2019     Objective: Vital Signs: BP 111/72 (BP Location:  Right Arm, Patient Position: Sitting, Cuff Size: Small)   Pulse 80   Ht 5\' 4"  (1.626 m)   Wt 131 lb 9.6 oz (59.7 kg)   BMI 22.59 kg/m    Physical Exam Eyes:     Conjunctiva/sclera: Conjunctivae normal.  Skin:    Comments: Faint erythematous patch over thenar eminence of right palm, left hand completely clear Few scattered erythematous areas on proximal arm extensor surfaces but distal arms cleared.  Neurological:     Mental  Status: She is alert.     Musculoskeletal Exam:  Elbows full ROM no tenderness or swelling Squaring of 1st CMC joints b/l, heberdon's nodes throughout DIP joints, good strength and full ROM no tenderness  CDAI Exam: CDAI Score: -- Patient Global: --; Provider Global: -- Swollen: --; Tender: -- Joint Exam 04/28/2020   No joint exam has been documented for this visit   There is currently no information documented on the homunculus. Go to the Rheumatology activity and complete the homunculus joint exam.  Investigation: No additional findings.  Imaging: No results found.  Recent Labs: Lab Results  Component Value Date   WBC 5.5 08/06/2018   HGB 15.3 (H) 08/06/2018   PLT 247.0 08/06/2018   NA 140 04/28/2020   K 4.8 04/28/2020   CL 102 04/28/2020   CO2 28 04/28/2020   GLUCOSE 85 04/28/2020   BUN 11 04/28/2020   CREATININE 0.64 04/28/2020   CALCIUM 10.1 04/28/2020    Speciality Comments: No specialty comments available.  Procedures:  No procedures performed Allergies: Patient has no known allergies.   Assessment / Plan:     Visit Diagnoses: Eczema, unspecified type Fatigue Long term use of high risk medication- Plan: Basic Metabolic Panel (BMET), predniSONE (DELTASONE) 5 MG tablet  She has a very good response to low dose prednisone with almost total clearance of her diffuse skin rashes. We discussed risks and benefits of continuing low dose prednisone including infections, hyperglycemia, hypertension, gastritis, fluid retention, weight gain, osteoporosis, and cataracts. Currently she is tolerating with no problems. Her symptoms were historically worst in the spring. I recommend we follow up in about 3 months and recommended she can try interruptions in the medication to see if a lower dose would be adequate. Checking BMP today for glucose and electrolytes. If we do end up using 6 months or longer will plan for DXA and possible treatment to prevent GIOP.  Generalized  osteoarthritis - Plan: Ambulatory referral to Occupational Therapy  She reports no contact regarding scheduling for OT evaluation, will ask staff to resend and she has preference if [place] area available, not urgent for evaluation and treatment 1st CMC joint OA..  Orders: Orders Placed This Encounter  Procedures  . Basic Metabolic Panel (BMET)  . Ambulatory referral to Occupational Therapy   Meds ordered this encounter  Medications  . predniSONE (DELTASONE) 5 MG tablet    Sig: Take 1 tablet (5 mg total) by mouth daily with breakfast.    Dispense:  90 tablet    Refill:  0     Follow-Up Instructions: Return in about 3 months (around 07/29/2020) for Eczema and OA low dose prednisone.   07/31/2020, MD  Note - This record has been created using [employer] Plan.  Chart creation errors have been sought, but may not always  have been located. Such creation errors do not reflect on  the standard of medical care.

## 2020-04-29 DIAGNOSIS — Z79899 Other long term (current) drug therapy: Secondary | ICD-10-CM | POA: Insufficient documentation

## 2020-04-29 LAB — BASIC METABOLIC PANEL
BUN: 11 mg/dL (ref 7–25)
CO2: 28 mmol/L (ref 20–32)
Calcium: 10.1 mg/dL (ref 8.6–10.4)
Chloride: 102 mmol/L (ref 98–110)
Creat: 0.64 mg/dL (ref 0.60–0.93)
Glucose, Bld: 85 mg/dL (ref 65–99)
Potassium: 4.8 mmol/L (ref 3.5–5.3)
Sodium: 140 mmol/L (ref 135–146)

## 2020-05-03 ENCOUNTER — Other Ambulatory Visit: Payer: Self-pay | Admitting: Family Medicine

## 2020-05-10 ENCOUNTER — Other Ambulatory Visit: Payer: Self-pay | Admitting: Family Medicine

## 2020-05-10 DIAGNOSIS — I10 Essential (primary) hypertension: Secondary | ICD-10-CM

## 2020-05-11 ENCOUNTER — Other Ambulatory Visit: Payer: Self-pay

## 2020-05-11 ENCOUNTER — Encounter: Payer: Self-pay | Admitting: Occupational Therapy

## 2020-05-11 ENCOUNTER — Ambulatory Visit: Payer: Medicare Other | Attending: Internal Medicine | Admitting: Occupational Therapy

## 2020-05-11 DIAGNOSIS — M6281 Muscle weakness (generalized): Secondary | ICD-10-CM | POA: Diagnosis not present

## 2020-05-11 DIAGNOSIS — M25342 Other instability, left hand: Secondary | ICD-10-CM | POA: Diagnosis not present

## 2020-05-11 DIAGNOSIS — M25341 Other instability, right hand: Secondary | ICD-10-CM | POA: Insufficient documentation

## 2020-05-11 DIAGNOSIS — R278 Other lack of coordination: Secondary | ICD-10-CM

## 2020-05-11 DIAGNOSIS — M25541 Pain in joints of right hand: Secondary | ICD-10-CM | POA: Insufficient documentation

## 2020-05-11 DIAGNOSIS — M79642 Pain in left hand: Secondary | ICD-10-CM

## 2020-05-11 DIAGNOSIS — M25542 Pain in joints of left hand: Secondary | ICD-10-CM | POA: Diagnosis not present

## 2020-05-11 DIAGNOSIS — M79641 Pain in right hand: Secondary | ICD-10-CM | POA: Insufficient documentation

## 2020-05-11 NOTE — Patient Instructions (Addendum)
Opposition (Active)   Touch tip of thumb to nail tip of each finger in turn, making an "O" shape. Repeat __10__ times. Do _4-6___ sessions per day.          IP Flexion (Active Blocked)   Brace thumb below tip joint. Bend joint as far as possible. Repeat __10__ times. Do _4-6___ sessions per day.   Composite Extension (Active)   Bring thumb up and out in hitchhiker position.  Repeat __10-15__ times. Do _4-6___ sessions per day.

## 2020-05-11 NOTE — Therapy (Signed)
[facility] [address] [place], [place], [address] Phone: [phone]   Fax:  [phone]  Occupational Therapy Evaluation  Patient Details  Name: Lorraine Fitzgerald MRN: [id number] Date of Birth: 1946-05-25 Referring Provider (OT): Sheliah Hatch   Encounter Date: 05/11/2020   OT End of Session - 05/11/20 1606    Visit Number 1    Number of Visits 7    Date for OT Re-Evaluation 06/22/20    Authorization Type Medicare A&B/BCBS Secondary. Follow Medicare Guidelines.    Authorization Time Period 10th visit progress note  KX modifier    Authorization - Visit Number 1    Progress Note Due on Visit 10    OT Start Time 1538    OT Stop Time 1629    OT Time Calculation (min) 51 min    Activity Tolerance Patient tolerated treatment well    Behavior During Therapy WFL for tasks assessed/performed           Past Medical History:  Diagnosis Date  . Alcohol abuse   . Allergy   . Arthritis   . Asthma   . Depression   . Emphysema of lung (HCC)   . History of chicken pox   . Hypertension   . Urine incontinence     Past Surgical History:  Procedure Laterality Date  . TUBAL LIGATION  1985    There were no vitals filed for this visit.   Subjective Assessment - 05/11/20 1644    Subjective  Pt is a 74 year old female that presents to neuro OPOT with OA in BUE CMC joints with right significantly more impaired than left. Pt reports pain consistently at 5/10 in BUE but gets worse with overuse. Pt reports significant difficulty with fasteners and dexterity and any weight bearing to RUE thumb. 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 (ukelele and celtic drum)    Pertinent History PMH of COPD, OA, HTN, HLD, Depression, and Eczema.    Limitations None    Patient Stated Goals Pt states goal as "to improve function"    Currently in Pain? Yes    Pain Score 5     Pain Location Hand     Pain Orientation Left;Right    Pain Descriptors / Indicators Aching;Sore    Pain Type Chronic pain    Pain Onset More than a month ago    Pain Frequency Constant    Aggravating Factors  overuse    Pain Relieving Factors Arnica pellets and Voltarin topical             [facility] OT Assessment - 05/11/20 1549      Assessment   Medical Diagnosis Osteoarthritis CMC joints BUE    Referring Provider (OT) Sheliah Hatch    Onset Date/Surgical Date 05/12/15   approx 5 years   Hand Dominance Right      Precautions   Precautions None      Restrictions   Weight Bearing Restrictions No      Balance Screen   Has the patient fallen in the past 6 months No      Home  Environment   Family/patient expects to be discharged to: Private residence      Prior Function   Level of Independence Independent    Vocation Retired    Freight forwarder    Leisure play musical instruments (ukelele and celtic drum), Editor, commissioning, reading      ADL  ADL comments Pt is independent with self-care with increased time and some difficulty with fasteners.       IADL   Shopping Takes care of all shopping needs independently    Light Housekeeping Maintains house alone or with occasional assistance    Meal Prep Plans, prepares and serves adequate meals independently    Community Mobility Drives own vehicle    Medication Management Is responsible for taking medication in correct dosages at correct time    Development worker, community financial matters independently (budgets, writes checks, pays rent, bills goes to bank), collects and keeps track of income      Written Expression   Dominant Hand Right      Cognition   Overall Cognitive Status Within Functional Limits for tasks assessed      Observation/Other Assessments   Focus on Therapeutic Outcomes (FOTO)  N/A      Sensation   Light Touch Appears Intact    Hot/Cold Appears Intact      Coordination   9 Hole Peg Test Right;Left    Right 9  Hole Peg Test 30.91    Left 9 Hole Peg Test 35.66      ROM / Strength   AROM / PROM / Strength AROM      AROM   Overall AROM  Within functional limits for tasks performed      Hand Function   Right Hand Grip (lbs) 37.9    Right Hand Lateral Pinch 8 lbs    Right Hand 3 Point Pinch 4 lbs    Left Hand Grip (lbs) 38.1    Left Hand Lateral Pinch 9 lbs    Left 3 point pinch 5 lbs    Comment Composite fist and extension 100%                           OT Education - 05/11/20 1644    Education Details Education on role and purpose of OT. Issued AROM for thumb CMC - see pt instructions    Person(s) Educated Patient    Methods Explanation;Demonstration    Comprehension Returned demonstration;Verbalized understanding            OT Short Term Goals - 05/11/20 1650      OT SHORT TERM GOAL #1   Title Pt will be indepedent with HEP 06/01/20    Time 3    Period Weeks    Status New    Target Date 06/01/20      OT SHORT TERM GOAL #2   Title Pt will verbalize understanding and compliance with splints and/or orthoses PRN    Time 3    Period Weeks    Status New      OT SHORT TERM GOAL #3   Title Pt will verbalize understanding of joint protection strategies for decreasing pain and maintaining function in BUE    Time 3    Period Weeks    Status New      OT SHORT TERM GOAL #4   Title Pt will report understanding of pain management strategies    Time 3    Period Weeks    Status New             OT Long Term Goals - 05/11/20 1654      OT LONG TERM GOAL #1   Title Pt will be independent with updated HEP 06/22/2020    Time 6    Period Weeks  Status New    Target Date 06/22/20      OT LONG TERM GOAL #2   Title Pt will demonstrate improved grip strength in BUE by 5 lbs for increase in functional use of BUE.    Baseline RUE 37.9, LUE 38.1    Time 6    Period Weeks    Status New      OT LONG TERM GOAL #3   Title Pt will report decreased pain with  leisure activities of painting and playing instruments with report of pain no more than 4/10.    Baseline 5/10    Time 6    Period Weeks    Status New      OT LONG TERM GOAL #4   Title Pt will demonstrate improved lateral pinch strength to 10 lbs or more in BUE.    Baseline R 8 L 9    Time 6    Period Weeks    Status New                 Plan - 05/11/20 1605    Clinical Impression Statement Pt is a 74 year old female that presents to neuro OPOT with OA in BUE [facility] joint with right worse than left. PMH of COPD, HTN, HLD, Depression, and Eczema.    OT Occupational Profile and History Problem Focused Assessment - Including review of records relating to presenting problem    Occupational performance deficits (Please refer to evaluation for details): IADL's;ADL's;Leisure    Body Structure / Function / Physical Skills ADL;Dexterity;Pain;Strength;UE functional use;IADL;ROM;FMC;Coordination    Rehab Potential Good    Clinical Decision Making Limited treatment options, no task modification necessary    Comorbidities Affecting Occupational Performance: None    Modification or Assistance to Complete Evaluation  No modification of tasks or assist necessary to complete eval    OT Frequency 1x / week    OT Duration 6 weeks    OT Treatment/Interventions Self-care/ADL training;Moist Heat;Fluidtherapy;DME and/or AE instruction;Splinting;Therapeutic activities;Ultrasound;Therapeutic exercise;Cryotherapy;Electrical Stimulation;Paraffin;Energy conservation;Manual Therapy;Patient/family education;Passive range of motion    Plan paraffin or fluido, AROM, joint protection strategies    OT Home Exercise Plan AROM for Thumb CMC BUE    Consulted and Agree with Plan of Care Patient           Patient will benefit from skilled therapeutic intervention in order to improve the following deficits and impairments:   Body Structure / Function / Physical Skills: ADL, Dexterity, Pain, Strength, UE functional  use, IADL, ROM, FMC, Coordination       Visit Diagnosis: Other lack of coordination - Plan: Ot plan of care cert/re-cert  Muscle weakness (generalized) - Plan: Ot plan of care cert/re-cert  Other instability, left hand - Plan: Ot plan of care cert/re-cert  Other instability, right hand - Plan: Ot plan of care cert/re-cert  Pain in right hand - Plan: Ot plan of care cert/re-cert  Pain in left hand - Plan: Ot plan of care cert/re-cert  Pain in joint of left hand - Plan: Ot plan of care cert/re-cert  Pain in joint of right hand - Plan: Ot plan of care cert/re-cert    Problem List Patient Active Problem List   Diagnosis Date Noted  . Long-term use of high-risk medication 04/29/2020  . Generalized osteoarthritis 03/31/2020  . Other fatigue 03/31/2020  . Eczema 12/08/2019  . Hyperlipidemia 08/06/2018  . Tobacco use disorder 06/01/2018  . Hypertension, essential, benign 06/01/2018  . Chronic obstructive pulmonary disease (HCC) 06/01/2018  .  Depression, major, in partial remission (HCC) 05/28/2018    Junious DresserKirstyn M Samyiah Halvorsen MOT, OTR/L  05/11/2020, 4:58 PM  [facility] [address] [place], [place], [address] Phone: [phone]   Fax:  [phone]  Name: Lorraine Fitzgerald MRN: [id number] Date of Birth: 1945/11/07

## 2020-05-17 ENCOUNTER — Encounter: Payer: Self-pay | Admitting: Occupational Therapy

## 2020-05-17 ENCOUNTER — Other Ambulatory Visit: Payer: Self-pay

## 2020-05-17 ENCOUNTER — Ambulatory Visit: Payer: Medicare Other | Attending: Internal Medicine | Admitting: Occupational Therapy

## 2020-05-17 DIAGNOSIS — M25542 Pain in joints of left hand: Secondary | ICD-10-CM

## 2020-05-17 DIAGNOSIS — M25341 Other instability, right hand: Secondary | ICD-10-CM

## 2020-05-17 DIAGNOSIS — R278 Other lack of coordination: Secondary | ICD-10-CM | POA: Diagnosis not present

## 2020-05-17 DIAGNOSIS — M25342 Other instability, left hand: Secondary | ICD-10-CM

## 2020-05-17 DIAGNOSIS — M25541 Pain in joints of right hand: Secondary | ICD-10-CM

## 2020-05-17 DIAGNOSIS — M79642 Pain in left hand: Secondary | ICD-10-CM

## 2020-05-17 DIAGNOSIS — M79641 Pain in right hand: Secondary | ICD-10-CM

## 2020-05-17 DIAGNOSIS — M6281 Muscle weakness (generalized): Secondary | ICD-10-CM

## 2020-05-17 NOTE — Patient Instructions (Signed)
Joint Protection (Grip)    Avoid: grasping thin utensils for prolonged periods. Solution: Hold thick-handled tools in dagger fashion when-ever possible for performing tasks such as stirring or scrub-bing. Relax fingers every 10 minutes during activity.  Joint Protection (Lifting)    Avoid picking up heavy items with one hand. Solution: Use both hands, and slide item whenever possible.  Joint Protection (Use Large Joints)    Avoid placing pressure on fingertips. Solution: Transfer work to other parts of body which are not affected or which have greater strength. Using body weight to push heavy doors open is an example.  Joint Protection (Carrying)    Avoid carrying items with weight on fingers. Solution: Use a shoulder bag or a back pack.  Joint Protection (Ulnar Deviation)    Avoid positions that cause fingers to lean sideways toward little finger. Solution: Use devices like jar-openers to assist in activities.  Copyright  VHI. All rights reserved.    

## 2020-05-17 NOTE — Therapy (Signed)
[facility] [address] [place], [place], [address] Phone: [phone]   Fax:  [phone]  Occupational Therapy Treatment  Patient Details  Name: Lorraine Fitzgerald MRN: [id number] Date of Birth: 1945-07-18 Referring Provider (OT): Sheliah Hatch   Encounter Date: 05/17/2020   OT End of Session - 05/17/20 1619    Visit Number 2    Number of Visits 7    Date for OT Re-Evaluation 06/22/20    Authorization Type Medicare A&B/BCBS Secondary. Follow Medicare Guidelines.    Authorization Time Period 10th visit progress note  KX modifier    Authorization - Visit Number 2    Progress Note Due on Visit 10    OT Start Time 1618    OT Stop Time 1700    OT Time Calculation (min) 42 min    Activity Tolerance Patient tolerated treatment well    Behavior During Therapy WFL for tasks assessed/performed           Past Medical History:  Diagnosis Date  . Alcohol abuse   . Allergy   . Arthritis   . Asthma   . Depression   . Emphysema of lung (HCC)   . History of chicken pox   . Hypertension   . Urine incontinence     Past Surgical History:  Procedure Laterality Date  . TUBAL LIGATION  1985    There were no vitals filed for this visit.   Subjective Assessment - 05/17/20 1618    Subjective  Pt reports she has been doing her exercises. Pt reports a little pain today 3/10 in the RUE [facility]    Pertinent History PMH of COPD, OA, HTN, HLD, Depression, and Eczema.    Limitations None    Patient Stated Goals Pt states goal as "to improve function"    Currently in Pain? Yes    Pain Score 3     Pain Location Hand    Pain Orientation Right;Left    Pain Descriptors / Indicators Aching;Sore    Pain Type Chronic pain    Pain Onset More than a month ago    Pain Frequency Constant                        OT Treatments/Exercises (OP) - 05/17/20 1623      ADLs   Eating working with adapted utensils for increasing  independence and skill with self-feeding and cutting up meats. Pt found greater ease and comfort with use of weighted utensils       Exercises   Exercises Hand      Modalities   Modalities Paraffin      RUE Paraffin   Number Minutes Paraffin 8 Minutes    RUE Paraffin Location Hand    Comments pain relief and stiffness      LUE Paraffin   Number Minutes Paraffin 8 Minutes    LUE Paraffin Location Hand    Comments pain relief and stiffness      Fine Motor Coordination (Hand/Wrist)   Fine Motor Coordination In hand manipuation training    In Hand Manipulation Training large dice finding numbers with RUE and manipulating chines medicine balls in RUE and again with LUE. Pt with greater difficulty with RUE                  OT Education - 05/17/20 1627    Education Details Issued and reviewed joint protection strategies    Person(s) Educated Patient  Methods Explanation;Demonstration;Handout    Comprehension Returned demonstration;Verbalized understanding            OT Short Term Goals - 05/17/20 1647      OT SHORT TERM GOAL #1   Title Pt will be indepedent with HEP 06/01/20    Time 3    Period Weeks    Status On-going    Target Date 06/01/20      OT SHORT TERM GOAL #2   Title Pt will verbalize understanding and compliance with splints and/or orthoses PRN    Time 3    Period Weeks    Status New      OT SHORT TERM GOAL #3   Title Pt will verbalize understanding of joint protection strategies for decreasing pain and maintaining function in BUE    Time 3    Period Weeks    Status On-going      OT SHORT TERM GOAL #4   Title Pt will report understanding of pain management strategies    Time 3    Period Weeks    Status On-going             OT Long Term Goals - 05/11/20 1654      OT LONG TERM GOAL #1   Title Pt will be independent with updated HEP 06/22/2020    Time 6    Period Weeks    Status New    Target Date 06/22/20      OT LONG TERM GOAL #2    Title Pt will demonstrate improved grip strength in BUE by 5 lbs for increase in functional use of BUE.    Baseline RUE 37.9, LUE 38.1    Time 6    Period Weeks    Status New      OT LONG TERM GOAL #3   Title Pt will report decreased pain with leisure activities of painting and playing instruments with report of pain no more than 4/10.    Baseline 5/10    Time 6    Period Weeks    Status New      OT LONG TERM GOAL #4   Title Pt will demonstrate improved lateral pinch strength to 10 lbs or more in BUE.    Baseline R 8 L 9    Time 6    Period Weeks    Status New                 Plan - 05/17/20 1627    Clinical Impression Statement Pt has some improvement with pain since last visit. Pt is compliant with HEP from evaluation.    OT Occupational Profile and History Problem Focused Assessment - Including review of records relating to presenting problem    Occupational performance deficits (Please refer to evaluation for details): IADL's;ADL's;Leisure    Body Structure / Function / Physical Skills ADL;Dexterity;Pain;Strength;UE functional use;IADL;ROM;FMC;Coordination    Rehab Potential Good    Clinical Decision Making Limited treatment options, no task modification necessary    Comorbidities Affecting Occupational Performance: None    Modification or Assistance to Complete Evaluation  No modification of tasks or assist necessary to complete eval    OT Frequency 1x / week    OT Duration 6 weeks    OT Treatment/Interventions Self-care/ADL training;Moist Heat;Fluidtherapy;DME and/or AE instruction;Splinting;Therapeutic activities;Ultrasound;Therapeutic exercise;Cryotherapy;Electrical Stimulation;Paraffin;Energy conservation;Manual Therapy;Patient/family education;Passive range of motion    Plan paraffin or fluido, AROM, joint protection strategies    OT Home Exercise Plan AROM for Thumb CMC BUE  Consulted and Agree with Plan of Care Patient           Patient will benefit  from skilled therapeutic intervention in order to improve the following deficits and impairments:   Body Structure / Function / Physical Skills: ADL, Dexterity, Pain, Strength, UE functional use, IADL, ROM, FMC, Coordination       Visit Diagnosis: Other lack of coordination  Muscle weakness (generalized)  Other instability, left hand  Other instability, right hand  Pain in right hand  Pain in left hand  Pain in joint of left hand  Pain in joint of right hand    Problem List Patient Active Problem List   Diagnosis Date Noted  . Long-term use of high-risk medication 04/29/2020  . Generalized osteoarthritis 03/31/2020  . Other fatigue 03/31/2020  . Eczema 12/08/2019  . Hyperlipidemia 08/06/2018  . Tobacco use disorder 06/01/2018  . Hypertension, essential, benign 06/01/2018  . Chronic obstructive pulmonary disease (HCC) 06/01/2018  . 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

## 2020-05-28 ENCOUNTER — Encounter: Payer: Self-pay | Admitting: Occupational Therapy

## 2020-05-28 ENCOUNTER — Ambulatory Visit: Payer: Medicare Other | Admitting: Occupational Therapy

## 2020-05-28 ENCOUNTER — Other Ambulatory Visit: Payer: Self-pay

## 2020-05-28 DIAGNOSIS — M6281 Muscle weakness (generalized): Secondary | ICD-10-CM | POA: Diagnosis not present

## 2020-05-28 DIAGNOSIS — M79641 Pain in right hand: Secondary | ICD-10-CM

## 2020-05-28 DIAGNOSIS — M25342 Other instability, left hand: Secondary | ICD-10-CM

## 2020-05-28 DIAGNOSIS — R278 Other lack of coordination: Secondary | ICD-10-CM | POA: Diagnosis not present

## 2020-05-28 DIAGNOSIS — M25541 Pain in joints of right hand: Secondary | ICD-10-CM

## 2020-05-28 DIAGNOSIS — M25341 Other instability, right hand: Secondary | ICD-10-CM | POA: Diagnosis not present

## 2020-05-28 DIAGNOSIS — M79642 Pain in left hand: Secondary | ICD-10-CM

## 2020-05-28 DIAGNOSIS — M25542 Pain in joints of left hand: Secondary | ICD-10-CM

## 2020-05-28 NOTE — Therapy (Signed)
[facility] [address] [place], [place], [address] Phone: [phone]   Fax:  [phone]  Occupational Therapy Treatment  Patient Details  Name: Lorraine Fitzgerald MRN: [id number] Date of Birth: 1945/10/19 Referring Provider (OT): Sheliah Hatch   Encounter Date: 05/28/2020   OT End of Session - 05/28/20 1538    Visit Number 3    Number of Visits 7    Date for OT Re-Evaluation 06/22/20    Authorization Type Medicare A&B/BCBS Secondary. Follow Medicare Guidelines.    Authorization Time Period 10th visit progress note  KX modifier    Authorization - Visit Number 3    Progress Note Due on Visit 10    OT Start Time 1538    OT Stop Time 1616    OT Time Calculation (min) 38 min    Activity Tolerance Patient tolerated treatment well    Behavior During Therapy WFL for tasks assessed/performed           Past Medical History:  Diagnosis Date  . Alcohol abuse   . Allergy   . Arthritis   . Asthma   . Depression   . Emphysema of lung (HCC)   . History of chicken pox   . Hypertension   . Urine incontinence     Past Surgical History:  Procedure Laterality Date  . TUBAL LIGATION  1985    There were no vitals filed for this visit.   Subjective Assessment - 05/28/20 1538    Subjective  Pt reports very minimal pain and much improvement with pain.    Pertinent History PMH of COPD, OA, HTN, HLD, Depression, and Eczema.    Limitations None    Patient Stated Goals Pt states goal as "to improve function"    Currently in Pain? Yes    Pain Score 1     Pain Location Hand    Pain Orientation Left;Right    Pain Descriptors / Indicators Aching;Sore    Pain Type Chronic pain    Pain Onset More than a month ago    Pain Frequency Constant                        OT Treatments/Exercises (OP) - 05/28/20 1539      RUE Paraffin   Number Minutes Paraffin 8 Minutes    RUE Paraffin Location Hand    Comments  pain relief      LUE Paraffin   Number Minutes Paraffin 8 Minutes    LUE Paraffin Location Hand    Comments pain relief      Fine Motor Coordination (Hand/Wrist)   Fine Motor Coordination In hand manipuation training    In Hand Manipulation Training chinese medicine balls and golf balls for in hand manipulation and turning balls with BUE                    OT Short Term Goals - 05/28/20 1544      OT SHORT TERM GOAL #1   Title Pt will be indepedent with HEP 06/01/20    Time 3    Period Weeks    Status Achieved    Target Date 06/01/20      OT SHORT TERM GOAL #2   Title Pt will verbalize understanding and compliance with splints and/or orthoses PRN    Time 3    Period Weeks    Status On-going      OT SHORT TERM GOAL #3  Title Pt will verbalize understanding of joint protection strategies for decreasing pain and maintaining function in BUE    Time 3    Period Weeks    Status Achieved      OT SHORT TERM GOAL #4   Title Pt will report understanding of pain management strategies    Time 3    Period Weeks    Status Achieved             OT Long Term Goals - 05/28/20 1544      OT LONG TERM GOAL #1   Title Pt will be independent with updated HEP 06/22/2020    Time 6    Period Weeks    Status On-going      OT LONG TERM GOAL #2   Title Pt will demonstrate improved grip strength in BUE by 5 lbs for increase in functional use of BUE.    Baseline RUE 37.9, LUE 38.1    Time 6    Period Weeks    Status On-going      OT LONG TERM GOAL #3   Title Pt will report decreased pain with leisure activities of painting and playing instruments with report of pain no more than 4/10.    Baseline 5/10    Time 6    Period Weeks    Status Achieved      OT LONG TERM GOAL #4   Title Pt will demonstrate improved lateral pinch strength to 10 lbs or more in BUE.    Baseline R 8 L 9    Time 6    Period Weeks    Status On-going                 Plan - 05/28/20 1543     Clinical Impression Statement Pt with much improvement with paraffin and pain for BUE    OT Occupational Profile and History Problem Focused Assessment - Including review of records relating to presenting problem    Occupational performance deficits (Please refer to evaluation for details): IADL's;ADL's;Leisure    Body Structure / Function / Physical Skills ADL;Dexterity;Pain;Strength;UE functional use;IADL;ROM;FMC;Coordination    Rehab Potential Good    Clinical Decision Making Limited treatment options, no task modification necessary    Comorbidities Affecting Occupational Performance: None    Modification or Assistance to Complete Evaluation  No modification of tasks or assist necessary to complete eval    OT Frequency 1x / week    OT Duration 6 weeks    OT Treatment/Interventions Self-care/ADL training;Moist Heat;Fluidtherapy;DME and/or AE instruction;Splinting;Therapeutic activities;Ultrasound;Therapeutic exercise;Cryotherapy;Electrical Stimulation;Paraffin;Energy conservation;Manual Therapy;Patient/family education;Passive range of motion    Plan paraffin or fluido, AROM, joint protection strategies    OT Home Exercise Plan AROM for Thumb CMC BUE    Consulted and Agree with Plan of Care Patient           Patient will benefit from skilled therapeutic intervention in order to improve the following deficits and impairments:   Body Structure / Function / Physical Skills: ADL,Dexterity,Pain,Strength,UE functional use,IADL,ROM,FMC,Coordination       Visit Diagnosis: Muscle weakness (generalized)  Other instability, left hand  Other instability, right hand  Pain in right hand  Pain in left hand  Pain in joint of left hand  Pain in joint of right hand  Other lack of coordination    Problem List Patient Active Problem List   Diagnosis Date Noted  . Long-term use of high-risk medication 04/29/2020  . Generalized osteoarthritis 03/31/2020  . Other fatigue 03/31/2020  .  Eczema 12/08/2019  . Hyperlipidemia 08/06/2018  . Tobacco use disorder 06/01/2018  . Hypertension, essential, benign 06/01/2018  . Chronic obstructive pulmonary disease (HCC) 06/01/2018  . Depression, major, in partial remission (HCC) 05/28/2018    Junious Dresser MOT, OTR/L  05/28/2020, 3:58 PM  [facility] [address] [place], [place], [address] Phone: [phone]   Fax:  [phone]  Name: Lorraine Fitzgerald MRN: [id number] Date of Birth: [birth year]

## 2020-06-02 ENCOUNTER — Ambulatory Visit: Payer: Medicare Other | Admitting: Occupational Therapy

## 2020-06-02 ENCOUNTER — Other Ambulatory Visit: Payer: Self-pay

## 2020-06-02 ENCOUNTER — Encounter: Payer: Self-pay | Admitting: Occupational Therapy

## 2020-06-02 DIAGNOSIS — M79641 Pain in right hand: Secondary | ICD-10-CM

## 2020-06-02 DIAGNOSIS — M25542 Pain in joints of left hand: Secondary | ICD-10-CM

## 2020-06-02 DIAGNOSIS — R278 Other lack of coordination: Secondary | ICD-10-CM

## 2020-06-02 DIAGNOSIS — M25342 Other instability, left hand: Secondary | ICD-10-CM | POA: Diagnosis not present

## 2020-06-02 DIAGNOSIS — M25341 Other instability, right hand: Secondary | ICD-10-CM

## 2020-06-02 DIAGNOSIS — M6281 Muscle weakness (generalized): Secondary | ICD-10-CM

## 2020-06-02 DIAGNOSIS — M79642 Pain in left hand: Secondary | ICD-10-CM | POA: Diagnosis not present

## 2020-06-02 DIAGNOSIS — M25541 Pain in joints of right hand: Secondary | ICD-10-CM

## 2020-06-02 NOTE — Therapy (Signed)
[place] [address] [place], [place], [address] Phone: [phone]   Fax:  [phone]  Occupational Therapy Treatment & Discharge Patient Details  Name: Lorraine Fitzgerald MRN: [id number] Date of Birth: 08/15/1945 Referring Provider (OT): Vernelle Emerald   Encounter Date: 06/02/2020   OT End of Session - 06/02/20 1704    Visit Number 4    Number of Visits 7    Date for OT Re-Evaluation 06/22/20    Authorization Type Medicare A&B/BCBS Secondary. Follow Medicare Guidelines.    Authorization Time Period 10th visit progress note  KX modifier    Authorization - Visit Number 4    Progress Note Due on Visit 10    OT Start Time 1704    OT Stop Time 1745    OT Time Calculation (min) 41 min    Activity Tolerance Patient tolerated treatment well    Behavior During Therapy WFL for tasks assessed/performed           Past Medical History:  Diagnosis Date  . Alcohol abuse   . Allergy   . Arthritis   . Asthma   . Depression   . Emphysema of lung ([place])   . History of chicken pox   . Hypertension   . Urine incontinence     Past Surgical History:  Procedure Laterality Date  . TUBAL LIGATION  1985    There were no vitals filed for this visit.   Subjective Assessment - 06/02/20 1704    Subjective  "my hand is hurting but there's a reason. I couldn't get a can open"    Pertinent History PMH of COPD, OA, HTN, HLD, Depression, and Eczema.    Limitations None    Patient Stated Goals Pt states goal as "to improve function"    Currently in Pain? Yes    Pain Score 3     Pain Location Hand    Pain Orientation Right    Pain Descriptors / Indicators Aching    Pain Type Chronic pain    Pain Onset More than a month ago    Pain Frequency Constant    Aggravating Factors  overuse and struggling with the can              OCCUPATIONAL THERAPY DISCHARGE SUMMARY  Visits from Start of Care: 4  Current functional level  related to goals / functional outcomes: Pt has made great gains with pain management and increase in use of strategies for joint protection and pain. Pt has improved grip strength tremendously with RUE and with lateral pinch strength. Pt is using adapted strategies and equipment for joint protection and for pain relief.    Remaining deficits: Pt with OA and continues to have some flair ups and pain.   Education / Equipment: Jodelle Red, Theraputty and HEP  Plan: Patient agrees to discharge.  Patient goals were partially met. Patient is being discharged due to meeting the stated rehab goals.  ?????                OT Treatments/Exercises (OP) - 06/02/20 1709      ADLs   ADL Comments education provided on parafin wax machines and overall maintanence of condition for long term care      Hand Exercises   Other Hand Exercises issued yellow theraputty      Modalities   Modalities Paraffin      RUE Paraffin   Number Minutes Paraffin 10 Minutes    RUE Paraffin  Location Hand    Comments pain relief and stiffness      LUE Paraffin   Number Minutes Paraffin 10 Minutes    LUE Paraffin Location Hand    Comments pain relief and stiffness                  OT Education - 06/02/20 1727    Education Details issued yellow theraputty - see pt instructions    Person(s) Educated Patient    Methods Explanation;Demonstration;Handout    Comprehension Returned demonstration;Verbalized understanding            OT Short Term Goals - 06/02/20 1710      OT SHORT TERM GOAL #1   Title Pt will be indepedent with HEP 06/01/20    Time 3    Period Weeks    Status Achieved    Target Date 06/01/20      OT SHORT TERM GOAL #2   Title Pt will verbalize understanding and compliance with splints and/or orthoses PRN    Time 3    Period Weeks    Status Achieved      OT SHORT TERM GOAL #3   Title Pt will verbalize understanding of joint protection strategies for decreasing pain and  maintaining function in BUE    Time 3    Period Weeks    Status Achieved      OT SHORT TERM GOAL #4   Title Pt will report understanding of pain management strategies    Time 3    Period Weeks    Status Achieved             OT Long Term Goals - 06/02/20 1721      OT LONG TERM GOAL #1   Title Pt will be independent with updated HEP 06/22/2020    Time 6    Period Weeks    Status Achieved      OT LONG TERM GOAL #2   Title Pt will demonstrate improved grip strength in BUE by 5 lbs for increase in functional use of BUE.    Baseline RUE 37.9, LUE 38.1    Time 6    Period Weeks    Status Partially Met   RUE 45.2 lbs LUE 40.1 lbs     OT LONG TERM GOAL #3   Title Pt will report decreased pain with leisure activities of painting and playing instruments with report of pain no more than 4/10.    Baseline 5/10    Time 6    Period Weeks    Status Achieved      OT LONG TERM GOAL #4   Title Pt will demonstrate improved lateral pinch strength to 10 lbs or more in BUE.    Baseline R 8 L 9    Time 6    Period Weeks    Status Achieved   RUE 12 LUE 12.5                Plan - 06/02/20 1710    Clinical Impression Statement Pt is improving with pain relief and utilizing strategies. Pt has made great gains with pain management and increase in use of strategies for joint protection and pain. Pt has improved grip strength tremendously with RUE and with lateral pinch strength. Pt is using adapted strategies and equipment for joint protection and for pain relief. Pt has met all STGs and LTGs with exception of LUE grip strength.    OT Occupational Profile and History Problem Focused  Assessment - Including review of records relating to presenting problem    Occupational performance deficits (Please refer to evaluation for details): IADL's;ADL's;Leisure    Body Structure / Function / Physical Skills ADL;Dexterity;Pain;Strength;UE functional use;IADL;ROM;FMC;Coordination    Rehab Potential  Good    Clinical Decision Making Limited treatment options, no task modification necessary    Comorbidities Affecting Occupational Performance: None    Modification or Assistance to Complete Evaluation  No modification of tasks or assist necessary to complete eval    OT Frequency 1x / week    OT Duration 6 weeks    OT Treatment/Interventions Self-care/ADL training;Moist Heat;Fluidtherapy;DME and/or AE instruction;Splinting;Therapeutic activities;Ultrasound;Therapeutic exercise;Cryotherapy;Electrical Stimulation;Paraffin;Energy conservation;Manual Therapy;Patient/family education;Passive range of motion    Plan discharge    OT Home Exercise Plan AROM for Thumb CMC BUE    Consulted and Agree with Plan of Care Patient           Patient will benefit from skilled therapeutic intervention in order to improve the following deficits and impairments:   Body Structure / Function / Physical Skills: ADL,Dexterity,Pain,Strength,UE functional use,IADL,ROM,FMC,Coordination       Visit Diagnosis: Muscle weakness (generalized)  Other instability, left hand  Other instability, right hand  Pain in right hand  Pain in left hand  Pain in joint of left hand  Pain in joint of right hand  Other lack of coordination    Problem List Patient Active Problem List   Diagnosis Date Noted  . Long-term use of high-risk medication 04/29/2020  . Generalized osteoarthritis 03/31/2020  . Other fatigue 03/31/2020  . Eczema 12/08/2019  . Hyperlipidemia 08/06/2018  . Tobacco use disorder 06/01/2018  . Hypertension, essential, benign 06/01/2018  . Chronic obstructive pulmonary disease ([place]) 06/01/2018  . Depression, major, in partial remission ([place]) 05/28/2018    Zachery Conch MOT, OTR/L  06/02/2020, 5:44 PM  [place] [address] [place], [place], [address] Phone: [phone]   Fax:  [phone]  Name: Lorraine Fitzgerald MRN:  [id number] Date of Birth: 01-19-1946

## 2020-06-02 NOTE — Patient Instructions (Signed)
1. Grip Strengthening (Resistive Putty)   Squeeze putty using thumb and all fingers. Repeat _20___ times. Do __2__ sessions per day.   2. Roll putty into tube on table and pinch between each finger and thumb x 10 reps each. (can do ring and small finger together)     Copyright  V[employer]. All rights reserved.   

## 2020-06-04 ENCOUNTER — Other Ambulatory Visit: Payer: Self-pay | Admitting: Family Medicine

## 2020-06-07 ENCOUNTER — Ambulatory Visit: Payer: Medicare Other | Admitting: Occupational Therapy

## 2020-06-08 ENCOUNTER — Ambulatory Visit: Payer: Medicare Other | Admitting: Occupational Therapy

## 2020-06-10 ENCOUNTER — Encounter: Payer: Medicare Other | Admitting: Occupational Therapy

## 2020-06-15 ENCOUNTER — Encounter: Payer: Medicare Other | Admitting: Occupational Therapy

## 2020-06-22 ENCOUNTER — Encounter: Payer: Medicare Other | Admitting: Occupational Therapy

## 2020-07-02 ENCOUNTER — Other Ambulatory Visit: Payer: Self-pay

## 2020-07-02 MED ORDER — AZELASTINE HCL 0.1 % NA SOLN
1.0000 | Freq: Two times a day (BID) | NASAL | 1 refills | Status: DC
Start: 2020-07-02 — End: 2020-08-11

## 2020-07-10 IMAGING — DX DG CHEST 2V
2 series · 2 of 2 positions shown · non-contrast
Comparison: None

CLINICAL DATA: COPD, worsening dyspnea

EXAM:
CHEST - 2 VIEW

[chest pa]
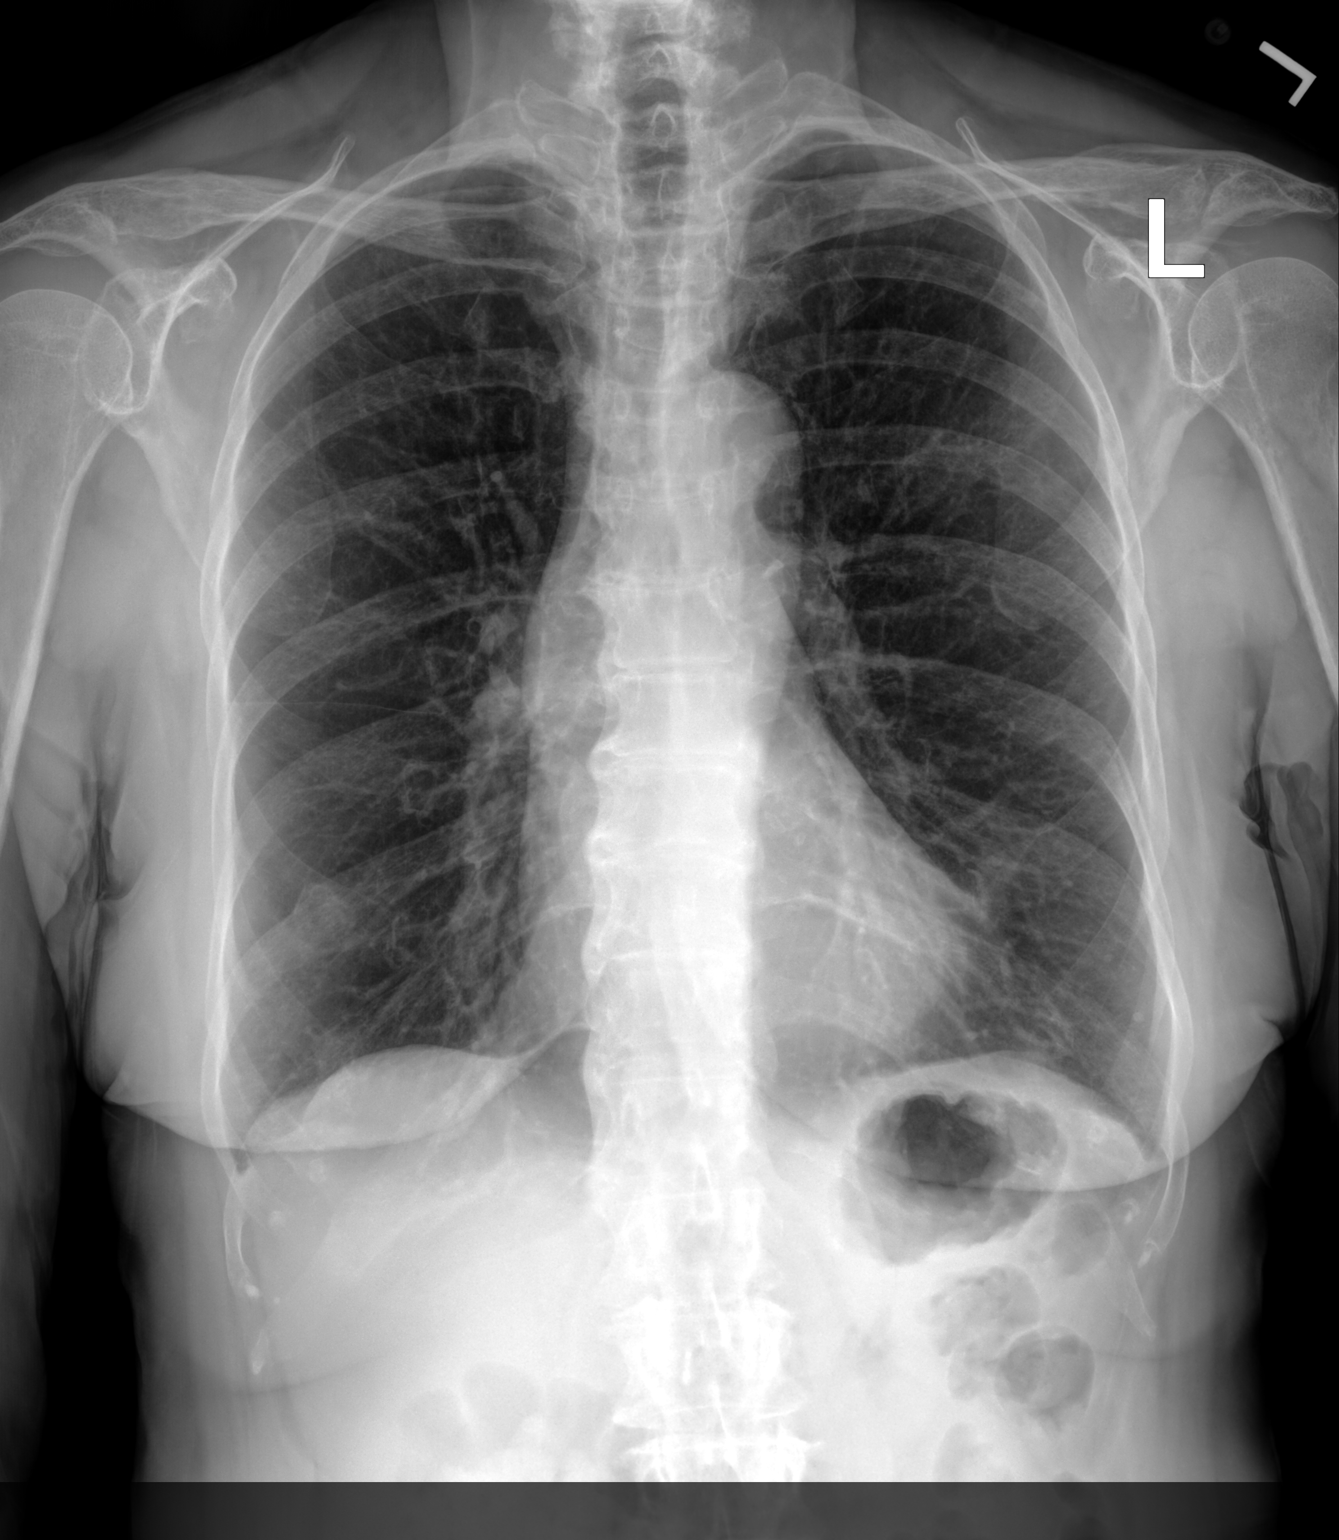

[chest lat]
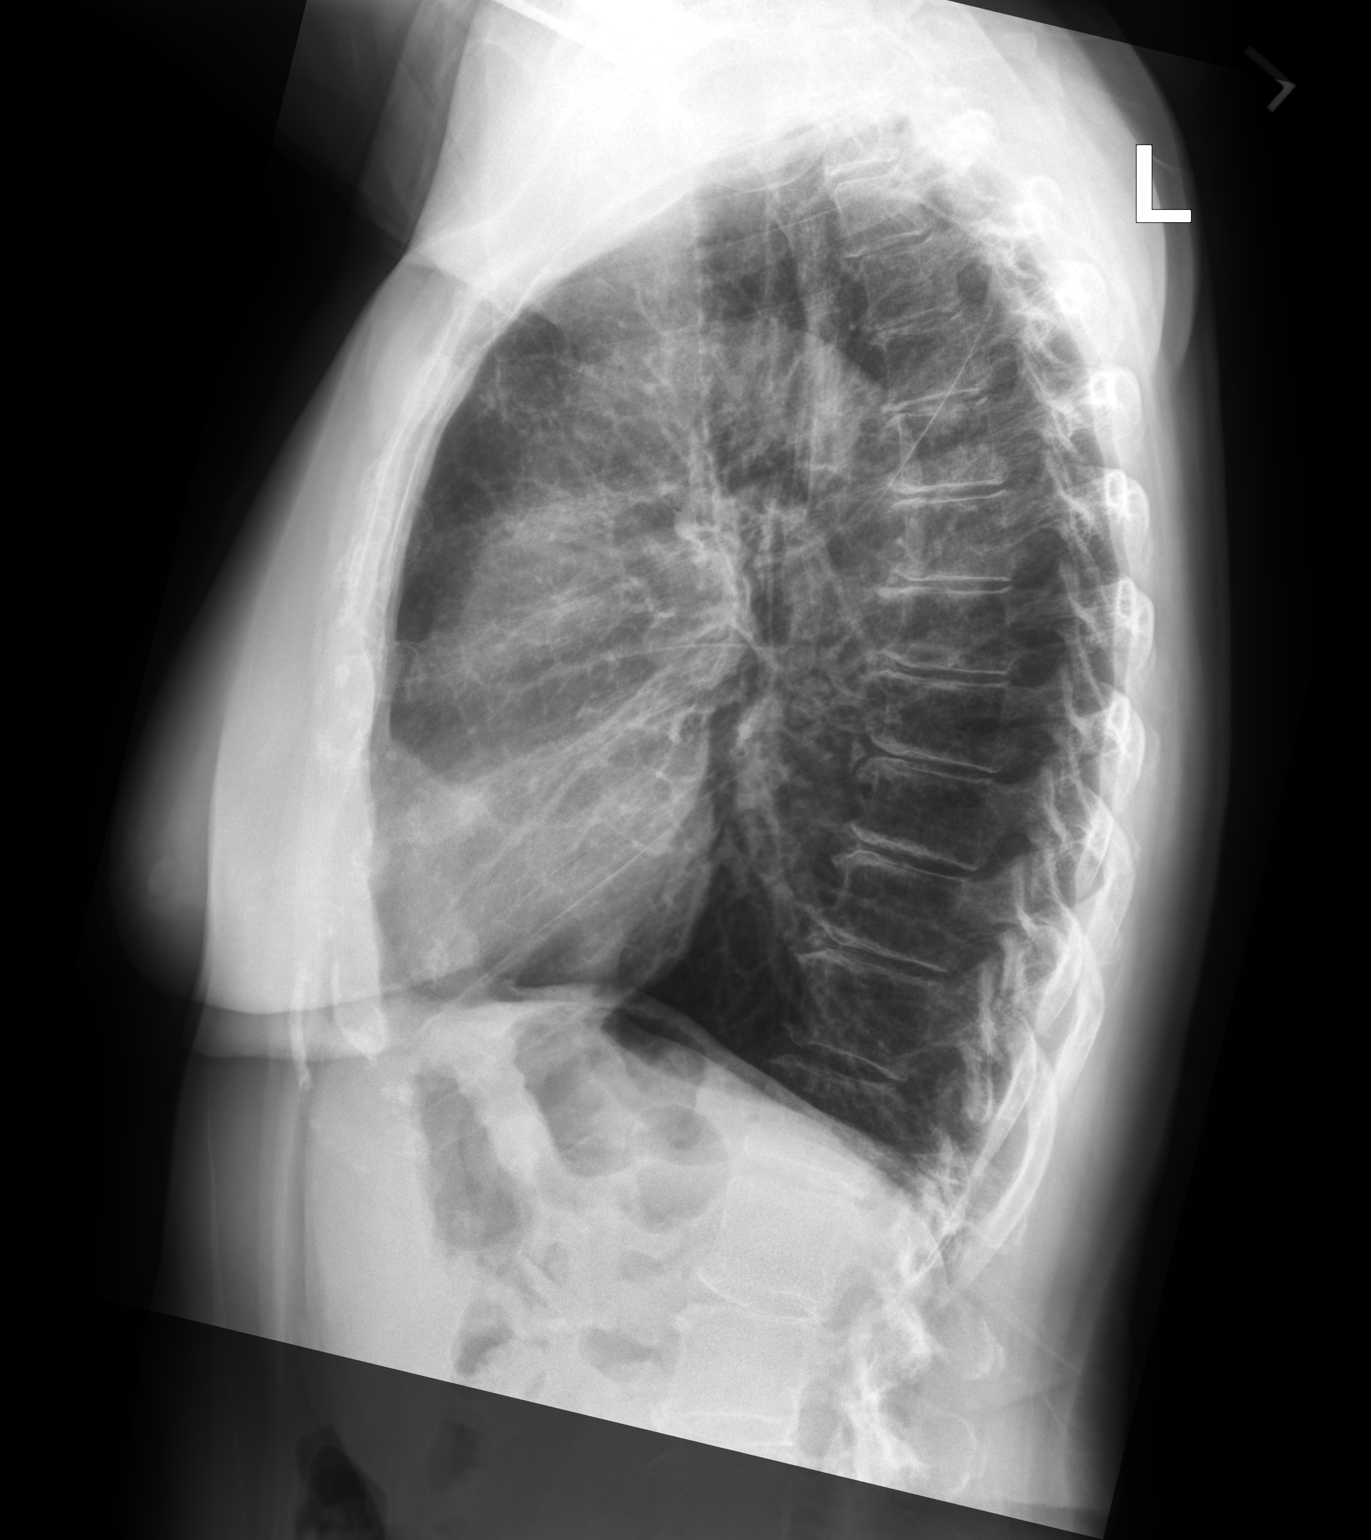

[2 of 2 positions shown; findings below may reference images not displayed]

FINDINGS: Normal heart size, mediastinal contours, and pulmonary vascularity.

Emphysematous and bronchitic changes consistent with COPD.

Tiny calcified granuloma LEFT base.

Lungs otherwise clear.

No infiltrate, pleural effusion, or pneumothorax.

Old healed fracture of the posterior RIGHT ninth rib.

Bones demineralized with scattered degenerative disc disease changes
of the thoracic spine.
IMPRESSION: COPD changes without acute abnormality.

## 2020-07-21 ENCOUNTER — Other Ambulatory Visit: Payer: Self-pay | Admitting: Family Medicine

## 2020-07-23 ENCOUNTER — Other Ambulatory Visit: Payer: Self-pay | Admitting: Internal Medicine

## 2020-07-23 DIAGNOSIS — M159 Polyosteoarthritis, unspecified: Secondary | ICD-10-CM

## 2020-07-23 DIAGNOSIS — R5383 Other fatigue: Secondary | ICD-10-CM

## 2020-07-23 DIAGNOSIS — L309 Dermatitis, unspecified: Secondary | ICD-10-CM

## 2020-08-02 NOTE — Progress Notes (Signed)
Office Visit Note  Patient: Lorraine Fitzgerald             Date of Birth: Sep 04, 1945           MRN: [id number]             PCP: [place], Betty G, MD Referring: [place], Betty G, MD Visit Date: 08/03/2020   Subjective:  Follow-up (Patient worked with OT and is now incorporating her teachings at home, per patient she has noticed great improvement with symptoms. )   History of Present Illness: Lorraine Fitzgerald is a 75 y.o. female here for follow up of diffuse eczematous skin rashes and generalized osteoarthritis currently on 5 mg prednisone. Since the last visit she started seeing OT regarding the hand arthritis symptoms. Today her symptoms are significantly improved her rashes are fully controlled and she did 5 sessions with OT and is continuing some of the stretches and treatments at home with benefit. She tried stopping the prednisone a few times and rashes did start to return after about 3-4 days. Otherwise she feels treatments are very effective so far. She may be moving out of state in the next few months however to help take care of her grandson which would be a long term move.   Previous HPI: She reports feeling very well since starting this medication with improvement in hand stiffness and pain, improvement in skin rash and pruritis, and improvement in fatigue. Near complete improvement of skin lesions over forearms and palms. She does still have arthritis symptoms in both hands especially the thumbs and is interested in following up about OT evaluation. She also has several questions about recommendations for vaccines. She has not experienced any episode of diffuse symptoms like past erythrodermic rash.    Review of Systems  Constitutional: Negative for fatigue.  HENT: Positive for mouth dryness and nose dryness. Negative for mouth sores.   Eyes: Positive for dryness. Negative for pain, itching and visual disturbance.  Respiratory: Positive for cough, shortness of breath and difficulty  breathing. Negative for hemoptysis.   Cardiovascular: Negative for chest pain, palpitations and swelling in legs/feet.  Gastrointestinal: Negative for abdominal pain, blood in stool, constipation and diarrhea.  Endocrine: Negative for increased urination.  Genitourinary: Negative for painful urination.  Musculoskeletal: Positive for arthralgias, joint pain and muscle weakness. Negative for joint swelling, myalgias, morning stiffness, muscle tenderness and myalgias.  Skin: Negative for color change, rash and redness.  Allergic/Immunologic: Negative for susceptible to infections.  Neurological: Negative for dizziness, numbness, headaches, memory loss and weakness.  Hematological: Negative for swollen glands.  Psychiatric/Behavioral: Negative for confusion and sleep disturbance.    PMFS History:  Patient Active Problem List   Diagnosis Date Noted  . Vitamin D deficiency 08/03/2020  . Long-term use of high-risk medication 04/29/2020  . Generalized osteoarthritis 03/31/2020  . Other fatigue 03/31/2020  . Eczema 12/08/2019  . Hyperlipidemia 08/06/2018  . Tobacco use disorder 06/01/2018  . Hypertension, essential, benign 06/01/2018  . Chronic obstructive pulmonary disease (HCC) 06/01/2018  . Depression, major, in partial remission (HCC) 05/28/2018    Past Medical History:  Diagnosis Date  . Alcohol abuse   . Allergy   . Arthritis   . Asthma   . Depression   . Emphysema of lung (HCC)   . History of chicken pox   . Hypertension   . Urine incontinence     Family History  Problem Relation Age of Onset  . COPD Mother   . Early death  Mother   . Miscarriages / [place] Mother   . Rheumatic fever Mother   . Arthritis Mother   . Hearing loss Father   . Alcohol abuse Brother   . Cancer Brother   . Heart disease Brother   . Congestive Heart Failure Daughter   . Alcohol abuse Son   . Arthritis Son   . Drug abuse Son   . Learning disabilities Son    Past Surgical History:   Procedure Laterality Date  . TUBAL LIGATION  1985   Social History   Social History Narrative  . Not on file   Immunization History  Administered Date(s) Administered  . Influenza, High Dose Seasonal PF 03/05/2018, 04/01/2019  . PFIZER(Purple Top)SARS-COV-2 Vaccination 07/14/2019, 08/05/2019, 04/29/2020     Objective: Vital Signs: BP 115/76 (BP Location: Left Arm, Patient Position: Sitting, Cuff Size: Normal)   Pulse 92   Ht 5\' 4"  (1.626 m)   Wt 132 lb 3.2 oz (60 kg)   BMI 22.69 kg/m    Physical Exam Skin:    General: Skin is warm and dry.     Findings: No rash.  Neurological:     General: No focal deficit present.     Mental Status: She is alert.  Psychiatric:        Mood and Affect: Mood normal.     Musculoskeletal Exam:  Wrists full ROM no tenderness or swelling Fingers 1st CMC joint squaring b/l, heberdon's nodes throughout DIPs b/l, no swelling or tenderness  Investigation: No additional findings.  Imaging: No results found.  Recent Labs: Lab Results  Component Value Date   WBC 5.5 08/06/2018   HGB 15.3 (H) 08/06/2018   PLT 247.0 08/06/2018   NA 138 08/03/2020   K 5.0 08/03/2020   CL 100 08/03/2020   CO2 30 08/03/2020   GLUCOSE 91 08/03/2020   BUN 13 08/03/2020   CREATININE 0.69 08/03/2020   BILITOT 0.6 08/03/2020   AST 19 08/03/2020   ALT 16 08/03/2020   PROT 6.9 08/03/2020   CALCIUM 10.8 (H) 08/03/2020   GFRAA 99 08/03/2020    Speciality Comments: No specialty comments available.  Procedures:  No procedures performed Allergies: Patient has no known allergies.   Assessment / Plan:     Visit Diagnoses: Generalized osteoarthritis - Plan: predniSONE (DELTASONE) 5 MG tablet  Osteoarthritis symptoms are doing better now with some ome exercises continuing OT recommendations. Also having improvement of other joint stiffness with the low dose prednisone. No additional treatments at this time.  Long-term use of high-risk medication - Plan:  COMPLETE METABOLIC PANEL WITH GFR  We discussed for a while risks of long term prednisone use including cataracts, glaucoma, diabetes, hypertension, fluid retention. Currently she seems to be tolerating this well. She had bone density scan years ago nothing recent.   Tobacco use disorder  Vitamin D deficiency - Plan: VITAMIN D 25 Hydroxy (Vit-D Deficiency, Fractures)  Checking vitamin D level today with risk modification for GIOP.  Eczema, unspecified type - Plan: predniSONE (DELTASONE) 5 MG tablet  Continuing low dose prednisone for diffuse eczema at this time. Recommended she do follow up with dermatology at some point for more targeted treatment such as dupixent due to less side effect risks.  Orders: Orders Placed This Encounter  Procedures  . VITAMIN D 25 Hydroxy (Vit-D Deficiency, Fractures)  . COMPLETE METABOLIC PANEL WITH GFR   Meds ordered this encounter  Medications  . predniSONE (DELTASONE) 5 MG tablet    Sig: TAKE 1  TABLET BY MOUTH EVERY DAY WITH BREAKFAST    Dispense:  180 tablet    Refill:  0     Follow-Up Instructions: No follow-ups on file.   Fuller Plan, MD  Note - This record has been created using [employer].  Chart creation errors have been sought, but may not always  have been located. Such creation errors do not reflect on  the standard of medical care.

## 2020-08-03 ENCOUNTER — Encounter: Payer: Self-pay | Admitting: Internal Medicine

## 2020-08-03 ENCOUNTER — Other Ambulatory Visit: Payer: Self-pay

## 2020-08-03 ENCOUNTER — Ambulatory Visit (INDEPENDENT_AMBULATORY_CARE_PROVIDER_SITE_OTHER): Payer: Medicare Other | Admitting: Internal Medicine

## 2020-08-03 VITALS — BP 115/76 | HR 92 | Ht 64.0 in | Wt 132.2 lb

## 2020-08-03 DIAGNOSIS — Z79899 Other long term (current) drug therapy: Secondary | ICD-10-CM | POA: Diagnosis not present

## 2020-08-03 DIAGNOSIS — L309 Dermatitis, unspecified: Secondary | ICD-10-CM | POA: Diagnosis not present

## 2020-08-03 DIAGNOSIS — F17200 Nicotine dependence, unspecified, uncomplicated: Secondary | ICD-10-CM

## 2020-08-03 DIAGNOSIS — R5383 Other fatigue: Secondary | ICD-10-CM | POA: Diagnosis not present

## 2020-08-03 DIAGNOSIS — M159 Polyosteoarthritis, unspecified: Secondary | ICD-10-CM | POA: Diagnosis not present

## 2020-08-03 DIAGNOSIS — E559 Vitamin D deficiency, unspecified: Secondary | ICD-10-CM | POA: Diagnosis not present

## 2020-08-03 MED ORDER — PREDNISONE 5 MG PO TABS: ORAL_TABLET | ORAL | 0 refills | Status: AC

## 2020-08-04 ENCOUNTER — Telehealth: Payer: Self-pay | Admitting: Family Medicine

## 2020-08-04 DIAGNOSIS — E785 Hyperlipidemia, unspecified: Secondary | ICD-10-CM

## 2020-08-04 LAB — COMPLETE METABOLIC PANEL WITH GFR
AG Ratio: 1.9 (calc) (ref 1.0–2.5)
ALT: 16 U/L (ref 6–29)
AST: 19 U/L (ref 10–35)
Albumin: 4.5 g/dL (ref 3.6–5.1)
Alkaline phosphatase (APISO): 57 U/L (ref 37–153)
BUN: 13 mg/dL (ref 7–25)
CO2: 30 mmol/L (ref 20–32)
Calcium: 10.8 mg/dL — ABNORMAL HIGH (ref 8.6–10.4)
Chloride: 100 mmol/L (ref 98–110)
Creat: 0.69 mg/dL (ref 0.60–0.93)
GFR, Est African American: 99 mL/min/{1.73_m2} (ref >=60)
GFR, Est Non African American: 86 mL/min/{1.73_m2} (ref >=60)
Globulin: 2.4 g/dL (calc) (ref 1.9–3.7)
Glucose, Bld: 91 mg/dL (ref 65–99)
Potassium: 5 mmol/L (ref 3.5–5.3)
Sodium: 138 mmol/L (ref 135–146)
Total Bilirubin: 0.6 mg/dL (ref 0.2–1.2)
Total Protein: 6.9 g/dL (ref 6.1–8.1)

## 2020-08-04 LAB — VITAMIN D 25 HYDROXY (VIT D DEFICIENCY, FRACTURES): Vit D, 25-Hydroxy: 45 ng/mL (ref 30–100)

## 2020-08-04 NOTE — Telephone Encounter (Signed)
Patient scheduled for her physical on 08/11/2020 and wants to know if she can come in early in the week or early in the day for her physical labs.  Please advise

## 2020-08-04 NOTE — Telephone Encounter (Signed)
Okay to schedule pt, the lab is in.

## 2020-08-04 NOTE — Telephone Encounter (Signed)
Patient scheduled for labs

## 2020-08-04 NOTE — Progress Notes (Signed)
Labs show vitamin D level is normal calcium level is slightly high. If she is currnetly taking a calcium supplement she probably does not need to continue doing so. This is only slightly high so I don't think any additional workup is needed currently but if it were a lot higher in the future may need investigating.

## 2020-08-04 NOTE — Telephone Encounter (Signed)
It seems like the only thing I need to check is FLP. Thanks, BJ

## 2020-08-06 ENCOUNTER — Other Ambulatory Visit: Payer: Self-pay

## 2020-08-06 ENCOUNTER — Other Ambulatory Visit (INDEPENDENT_AMBULATORY_CARE_PROVIDER_SITE_OTHER): Payer: Medicare Other

## 2020-08-06 DIAGNOSIS — E785 Hyperlipidemia, unspecified: Secondary | ICD-10-CM | POA: Diagnosis not present

## 2020-08-06 LAB — LIPID PANEL
Cholesterol: 222 mg/dL — ABNORMAL HIGH (ref 0–200)
HDL: 103.3 mg/dL (ref >39.00)
LDL Cholesterol: 103 mg/dL — ABNORMAL HIGH (ref 0–99)
NonHDL: 118.7
Total CHOL/HDL Ratio: 2
Triglycerides: 78 mg/dL (ref 0.0–149.0)
VLDL: 15.6 mg/dL (ref 0.0–40.0)

## 2020-08-09 ENCOUNTER — Telehealth: Payer: Self-pay

## 2020-08-09 NOTE — Telephone Encounter (Signed)
Patient called stating at her appointment on 08/03/20 Dr. Dimple Casey agreed to send in prescription of Prednisone (180 tablets) to [facility] to hold her over until she is able to schedule with another physician once she moves.  [facility] states [facility] was only able to [facility] tablets.  Per Aundra Millet Dr. Dimple Casey agree to send in a refill for the additional 90 tablets in May.  Patient states she will call back to let her know what pharmacy to send it to.

## 2020-08-10 NOTE — Progress Notes (Signed)
HPI: Lorraine Fitzgerald is a 75 y.o. female, who is here today for follow up.   She was last seen on 12/01/19. AWV on 12/16/19. No new problems since her last visit.   -Generalized OA: Following with Dr Benjamine Mola, rheumatologist, every 3-4 months. Started hand therapy, which has helped with IP/hand joint pain.  -Eczema has been better controlled with daily Prednisone. She is on Prednisone 5 mg daily, when she tries to stop it erythematous rash reoccurs.  She has not had a DEXA.  Ca++ supplementation through her diet. Ca++ mildly elevated at 10.8. Vit D deficiency: She is on Vit D 5000 U daily.  Active around the house and walks her dog 2 times per day. She does not have an exercise routine but sometimes she does stretching exercises.  -COPD/asthma: Spiriva inh once daily and Wixela 250-50 mcg bid. She doe snot use Albuterol inh often.  She feels more SOB that usual with exertion, no associated CP,palpitations,or diaphoresis. It is intermittent. Associated with cough and wheezing. + Smoker, 2 PPD. Tobacco use started around age 77, 2 PPD in average.  Usually symptoms are exacerbated by walking outdoors in cold weather. She does walk her dog around 1 Am, when temp can be in the 30's. She loves to be outdoors. Cold weather also aggravate nasal congestion and rhinorrhea.  No nocturnal symptoms that interfere with sleep.  Last CXR 08/07/18: COPD changes without acute abnormality.  -Depression and anxiety: Dx'ed in 1995. Past hx of alcohol abuse, sober sober for about 4-5 years. Attends AA meetings.  She is on Sertraline 50 mg daily. She feels like medication is helping. On PHQ she reported suicidal thoughts, denies plan or ideation.  She lives alone but keeps in touch with her daughter in [place]. Her daughter is keeping her 38 months granddaughter (her son's daughter), so eventually she will be moving with her to help with child care. She does not have a date yet,  her  daughter is trying to move to a house with no as many stairs as she has now (20+).  Depression screen [facility] 2/9 08/11/2020 12/16/2019 08/06/2018  Decreased Interest 2 0 0  Down, Depressed, Hopeless 3 0 0  PHQ - 2 Score 5 0 0  Altered sleeping 3 0 -  Tired, decreased energy 3 0 -  Change in appetite 3 0 -  Feeling bad or failure about yourself  1 0 -  Trouble concentrating 2 0 -  Moving slowly or fidgety/restless 1 0 -  Suicidal thoughts 3 0 -  PHQ-9 Score 21 0 -  Difficult doing work/chores Very difficult Not difficult at all -   GAD 7 : Generalized Anxiety Score 08/11/2020 08/11/2020  Nervous, Anxious, on Edge 3 0  Control/stop worrying 3 0  Worry too much - different things 3 0  Trouble relaxing 3 0  Restless 2 0  Easily annoyed or irritable 3 0  Afraid - awful might happen 2 0  Total GAD 7 Score 19 0  Anxiety Difficulty Very difficult -   HTN:Dx'ed in 2009. She is not checking BP regularly. She is on Benazepril-HCTZ 20-12.5 mg daily. Lab Results  Component Value Date   CREATININE 0.69 08/03/2020   BUN 13 08/03/2020   NA 138 08/03/2020   K 5.0 08/03/2020   CL 100 08/03/2020   CO2 30 08/03/2020   Negative for severe/frequent headache, visual changes, exertional chest pain, claudication, focal weakness, or edema.  HLD: She is on non pharmacologic  treatment. Lab Results  Component Value Date   CHOL 222 (H) 08/06/2020   HDL 103.30 08/06/2020   LDLCALC 103 (H) 08/06/2020   TRIG 78.0 08/06/2020   CHOLHDL 2 08/06/2020    Review of Systems  Constitutional: Positive for fatigue. Negative for activity change, appetite change and fever.  HENT: Positive for congestion and postnasal drip. Negative for mouth sores, nosebleeds and sore throat.   Cardiovascular: Negative for chest pain, palpitations and leg swelling.  Gastrointestinal: Negative for abdominal pain, nausea and vomiting.       Negative for changes in bowel habits.  Genitourinary: Negative for decreased urine volume and  hematuria.  Musculoskeletal: Positive for arthralgias. Negative for gait problem.  Allergic/Immunologic: Positive for environmental allergies.  Neurological: Negative for syncope, facial asymmetry and weakness.  Psychiatric/Behavioral: Positive for sleep disturbance. Negative for confusion. The patient is nervous/anxious.   Rest of ROS, see pertinent positives sand negatives in HPI  Current Outpatient Medications on File Prior to Visit  Medication Sig Dispense Refill  . albuterol (PROVENTIL HFA) 108 (90 Base) MCG/ACT inhaler Inhale 2 puffs into the lungs every 6 (six) hours as needed for wheezing or shortness of breath. 18 g 6  . ALBUTEROL IN Inhale into the lungs as needed.    Marland Kitchen ascorbic acid (VITAMIN C) 500 MG tablet Take 500 mg by mouth daily.    . COLLAGEN PO Take by mouth.    . diclofenac Sodium (VOLTAREN) 1 % GEL Apply topically as needed.    . Multiple Vitamin (MULTIVITAMIN) tablet Take 1 tablet by mouth daily.    . predniSONE (DELTASONE) 5 MG tablet TAKE 1 TABLET BY MOUTH EVERY DAY WITH BREAKFAST 180 tablet 0  . WIXELA INHUB 250-50 MCG/DOSE AEPB USE 1 INHALATION ORALLY    TWICE DAILY 180 each 2  . Nettle, Urtica Dioica, (NETTLE LEAF PO) Take 500 mg by mouth as needed. (Patient not taking: No sig reported)     No current facility-administered medications on file prior to visit.   Past Medical History:  Diagnosis Date  . Alcohol abuse   . Allergy   . Arthritis   . Asthma   . Depression   . Emphysema of lung ([place])   . History of chicken pox   . Hypertension   . Urine incontinence    No Known Allergies  Social History   Socioeconomic History  . Marital status: Widowed    Spouse name: Not on file  . Number of children: 3  . Years of education: Not on file  . Highest education level: Not on file  Occupational History  . Not on file  Tobacco Use  . Smoking status: Current Every Day Smoker    Packs/day: 1.50    Types: Cigarettes  . Smokeless tobacco: Never Used   Vaping Use  . Vaping Use: Former  Substance and Sexual Activity  . Alcohol use: Not Currently  . Drug use: Never  . Sexual activity: Not Currently  Other Topics Concern  . Not on file  Social History Narrative  . Not on file   Social Determinants of Health   Financial Resource Strain: Low Risk   . Difficulty of Paying Living Expenses: Not hard at all  Food Insecurity: No Food Insecurity  . Worried About Charity fundraiser in the Last Year: Never true  . Ran Out of Food in the Last Year: Never true  Transportation Needs: No Transportation Needs  . Lack of Transportation (Medical): No  . Lack of  Transportation (Non-Medical): No  Physical Activity: Sufficiently Active  . Days of Exercise per Week: 7 days  . Minutes of Exercise per Session: 30 min  Stress: No Stress Concern Present  . Feeling of Stress : Not at all  Social Connections: Moderately Isolated  . Frequency of Communication with Friends and Family: More than three times a week  . Frequency of Social Gatherings with Friends and Family: Once a week  . Attends Religious Services: Never  . Active Member of Clubs or Organizations: Yes  . Attends Archivist Meetings: 1 to 4 times per year  . Marital Status: Widowed   Vitals:   08/11/20 1348  BP: 128/70  Pulse: 98  Resp: 16  SpO2: 93%   Body mass index is 22.57 kg/m.  Physical Exam Vitals and nursing note reviewed.  Constitutional:      General: She is not in acute distress.    Appearance: She is well-developed and normal weight.  HENT:     Head: Normocephalic and atraumatic.     Mouth/Throat:     Mouth: Oropharynx is clear and moist and mucous membranes are normal.  Eyes:     Conjunctiva/sclera: Conjunctivae normal.  Cardiovascular:     Rate and Rhythm: Normal rate and regular rhythm.     Pulses:          Dorsalis pedis pulses are 2+ on the right side and 2+ on the left side.     Heart sounds: No murmur heard.   Pulmonary:     Effort:  Pulmonary effort is normal. No respiratory distress.     Breath sounds: Normal breath sounds.  Abdominal:     Palpations: Abdomen is soft. There is no hepatomegaly or mass.     Tenderness: There is no abdominal tenderness.  Musculoskeletal:        General: No edema.  Lymphadenopathy:     Cervical: No cervical adenopathy.  Skin:    General: Skin is warm.     Findings: No erythema or rash.  Neurological:     General: No focal deficit present.     Mental Status: She is alert and oriented to person, place, and time.     Cranial Nerves: No cranial nerve deficit.     Gait: Gait normal.     Deep Tendon Reflexes: Strength normal.     Comments: Otherwise stable gait, not assisted.  Psychiatric:        Mood and Affect: Mood is anxious. Mood is not depressed.        Thought Content: Thought content does not include homicidal or suicidal ideation. Thought content does not include homicidal or suicidal plan.   ASSESSMENT AND PLAN:   Lorraine Fitzgerald was seen today for follow-up.  Orders Placed This Encounter  Procedures  . DG Bone Density   The ASCVD Risk score Mikey Bussing DC Jr., et al., 2013) failed to calculate for the following reasons:   The valid HDL cholesterol range is 20 to 100 mg/dL  Major depressive disorder with current active episode She is reporting problem as stable. Continue Sertraline 50 mg daily. Instructed about warning signs.  Hyperlipidemia Mild and improved. She would like to continue non pharmacologic treatment, which I think is appropriate.   Chronic obstructive pulmonary disease (HCC) Recommend Albuterol inh 15-20 min before going for a walk in cold weather. Smoking cessation encouraged. Continue Spiriva once daily and Wixela 250-50 mcg bid.  Hypertension, essential, benign BP adequately controlled. Continue Benazepril-HCTZ 20-12.5 mg  daily and low salt diet. She has BP checked and labs done reqularly at her rheumatologist's office.   Tobacco use  disorder Adverse effects of tobacco use and benefits of smoking cessation discussed. She is considering smoking cessation eventually but not at this time. She is not interested in low density CT for lung cancer screening.  Spent 44 minutes.  During this time history was obtained and documented, examination was performed, prior labs/imaging reviewed, and assessment/plan discussed.  Return in about 1 year (around 08/11/2021) for Follow up.   Emmylou Bieker G. [place], MD  [facility]. [place] office.   A few things to remember from today's visit:  Hyperlipidemia, unspecified hyperlipidemia type  Hypertension, essential, benign - Plan: benazepril-hydrochlorthiazide (LOTENSIN HCT) 20-12.5 MG tablet  Asymptomatic postmenopausal estrogen deficiency - Plan: DG Bone Density  Chronic obstructive pulmonary disease, unspecified COPD type ([place]), Chronic - Plan: tiotropium (SPIRIVA HANDIHALER) 18 MCG inhalation capsule  Major depressive disorder in partial remission, unspecified whether recurrent ([place]), Chronic  Chronic obstructive pulmonary disease, unspecified COPD type ([place]) - Plan: tiotropium (SPIRIVA HANDIHALER) 18 MCG inhalation capsule  Hypercalcemia  If you need refills please call your pharmacy. Do not use My Chart to request refills or for acute issues that need immediate attention.    Please be sure medication list is accurate. If a new problem present, please set up appointment sooner than planned today.  A few tips:  -As we age balance is not as good as it was, so there is a higher risks for falls. Please remove small rugs and furniture that is "in your way" and could increase the risk of falls. Stretching exercises may help with fall prevention: Yoga and Tai Chi are some examples. Low impact exercise is better, so you are not very achy the next day.  -Sun screen and avoidance of direct sun light recommended. Caution with dehydration, if working outdoors be sure to drink  enough fluids.  - Some medications are not safe as we age, increases the risk of side effects and can potentially interact with other medication you are also taken;  including some of over the counter medications. Be sure to let me know when you start a new medication even if it is a dietary/vitamin supplement.   -Healthy diet low in red meet/animal fat and sugar + regular physical activity is recommended.

## 2020-08-11 ENCOUNTER — Ambulatory Visit (INDEPENDENT_AMBULATORY_CARE_PROVIDER_SITE_OTHER): Payer: Medicare Other | Admitting: Family Medicine

## 2020-08-11 ENCOUNTER — Encounter: Payer: Self-pay | Admitting: Family Medicine

## 2020-08-11 ENCOUNTER — Other Ambulatory Visit: Payer: Self-pay

## 2020-08-11 VITALS — BP 128/70 | HR 98 | Resp 16 | Ht 64.0 in | Wt 131.5 lb

## 2020-08-11 DIAGNOSIS — I10 Essential (primary) hypertension: Secondary | ICD-10-CM | POA: Diagnosis not present

## 2020-08-11 DIAGNOSIS — F17200 Nicotine dependence, unspecified, uncomplicated: Secondary | ICD-10-CM

## 2020-08-11 DIAGNOSIS — F321 Major depressive disorder, single episode, moderate: Secondary | ICD-10-CM

## 2020-08-11 DIAGNOSIS — J449 Chronic obstructive pulmonary disease, unspecified: Secondary | ICD-10-CM

## 2020-08-11 DIAGNOSIS — Z780 Asymptomatic menopausal state: Secondary | ICD-10-CM | POA: Diagnosis not present

## 2020-08-11 DIAGNOSIS — E785 Hyperlipidemia, unspecified: Secondary | ICD-10-CM

## 2020-08-11 MED ORDER — SPIRIVA HANDIHALER 18 MCG IN CAPS
18.0000 ug | ORAL_CAPSULE | Freq: Every day | RESPIRATORY_TRACT | 3 refills | Status: AC
Start: 2020-08-11 — End: ?

## 2020-08-11 MED ORDER — SERTRALINE HCL 100 MG PO TABS
50.0000 mg | ORAL_TABLET | Freq: Every day | ORAL | 2 refills | Status: AC
Start: 2020-08-11 — End: ?

## 2020-08-11 MED ORDER — BENAZEPRIL-HYDROCHLOROTHIAZIDE 20-12.5 MG PO TABS: 1.0000 | ORAL_TABLET | Freq: Every day | ORAL | 3 refills | Status: AC

## 2020-08-11 NOTE — Assessment & Plan Note (Signed)
Mild and improved. She would like to continue non pharmacologic treatment, which I think is appropriate.

## 2020-08-11 NOTE — Patient Instructions (Addendum)
A few things to remember from today's visit:  Hyperlipidemia, unspecified hyperlipidemia type  Hypertension, essential, benign - Plan: benazepril-hydrochlorthiazide (LOTENSIN HCT) 20-12.5 MG tablet  Asymptomatic postmenopausal estrogen deficiency - Plan: DG Bone Density  Chronic obstructive pulmonary disease, unspecified COPD type ([place]), Chronic - Plan: tiotropium (SPIRIVA HANDIHALER) 18 MCG inhalation capsule  Major depressive disorder in partial remission, unspecified whether recurrent ([place]), Chronic  Chronic obstructive pulmonary disease, unspecified COPD type ([place]) - Plan: tiotropium (SPIRIVA HANDIHALER) 18 MCG inhalation capsule  Hypercalcemia  If you need refills please call your pharmacy. Do not use My Chart to request refills or for acute issues that need immediate attention.    Please be sure medication list is accurate. If a new problem present, please set up appointment sooner than planned today.  A few tips:  -As we age balance is not as good as it was, so there is a higher risks for falls. Please remove small rugs and furniture that is "in your way" and could increase the risk of falls. Stretching exercises may help with fall prevention: Yoga and Tai Chi are some examples. Low impact exercise is better, so you are not very achy the next day.  -Sun screen and avoidance of direct sun light recommended. Caution with dehydration, if working outdoors be sure to drink enough fluids.  - Some medications are not safe as we age, increases the risk of side effects and can potentially interact with other medication you are also taken;  including some of over the counter medications. Be sure to let me know when you start a new medication even if it is a dietary/vitamin supplement.   -Healthy diet low in red meet/animal fat and sugar + regular physical activity is recommended.

## 2020-08-11 NOTE — Assessment & Plan Note (Signed)
She is reporting problem as stable. Continue Sertraline 50 mg daily. Instructed about warning signs.

## 2020-08-11 NOTE — Assessment & Plan Note (Signed)
Recommend Albuterol inh 15-20 min before going for a walk in cold weather. Smoking cessation encouraged. Continue Spiriva once daily and Wixela 250-50 mcg bid.

## 2020-08-11 NOTE — Assessment & Plan Note (Signed)
Adverse effects of tobacco use and benefits of smoking cessation discussed. She is considering smoking cessation eventually but not at this time. She is not interested in low density CT for lung cancer screening.

## 2020-08-11 NOTE — Assessment & Plan Note (Signed)
BP adequately controlled. Continue Benazepril-HCTZ 20-12.5 mg daily and low salt diet. She has BP checked and labs done reqularly at her rheumatologist's office.

## 2020-08-31 DIAGNOSIS — F419 Anxiety disorder, unspecified: Secondary | ICD-10-CM | POA: Insufficient documentation

## 2020-09-02 ENCOUNTER — Telehealth (INDEPENDENT_AMBULATORY_CARE_PROVIDER_SITE_OTHER): Payer: Medicare Other | Admitting: Internal Medicine

## 2020-09-02 ENCOUNTER — Encounter: Payer: Self-pay | Admitting: Internal Medicine

## 2020-09-02 ENCOUNTER — Other Ambulatory Visit: Payer: Self-pay

## 2020-09-02 ENCOUNTER — Encounter: Payer: Self-pay | Admitting: Family Medicine

## 2020-09-02 DIAGNOSIS — R531 Weakness: Secondary | ICD-10-CM | POA: Diagnosis not present

## 2020-09-02 DIAGNOSIS — R197 Diarrhea, unspecified: Secondary | ICD-10-CM | POA: Diagnosis not present

## 2020-09-02 DIAGNOSIS — F17200 Nicotine dependence, unspecified, uncomplicated: Secondary | ICD-10-CM | POA: Diagnosis not present

## 2020-09-02 DIAGNOSIS — J449 Chronic obstructive pulmonary disease, unspecified: Secondary | ICD-10-CM | POA: Diagnosis not present

## 2020-09-02 NOTE — Progress Notes (Signed)
Virtual Visit via Telephone Note  I connected with@ on 09/02/20 at 10:30 AM EDT by telephone and verified that I am speaking with the correct person using two identifiers.   I discussed the limitations, risks, security and privacy concerns of performing an evaluation and management service by telephone and the limited availability of in person appointments. tThere may be a patient responsible charge related to this service. The patient expressed understanding and agreed to proceed.  Location patient: home Location provider:  home office Participants present for the call: patient, provider Patient did not have a visit in the prior 7 days to address this/these issue(s).   History of Present Illness: Lorraine Fitzgerald presents with new illness.  About 4 days ago she had the onset of diarrhea frequent about every hour no blood and also had some rib cage pain that she felt was from lifting at child's and not related she has underlying COPD cannot say that her cough is worse.  She was in [place] flew back yesterday needed wheelchair and transition. Did not eat anything to avoid diarrhea but is using Pedialyte.  Her temperature yesterday was 100.5 when she got here her thermometer is not working but she feels very weak used to be able to walk her dog but now just wants to lay down.  Cannot say her breathing shortness of breath is a lot worse she has not tried food today but had been able to use oatmeal. She did a home Covid test which was negative.  No one else has been sick or exposures that are obvious except she did have a travel.   The 24-month-old in her household has not been sick. Observations/Objective: Patient sounds personable and well on the phone.  No obvious dyspnea with speech cognitively intact I do not appreciate any SOB.  Affecting her speech.  She tried to do her pulse might be 60 uncertain Speech and thought processing are grossly intact. Patient reported vitals: Lab Results   Component Value Date   WBC 5.5 08/06/2018   HGB 15.3 (H) 08/06/2018   HCT 45.3 08/06/2018   PLT 247.0 08/06/2018   GLUCOSE 91 08/03/2020   CHOL 222 (H) 08/06/2020   TRIG 78.0 08/06/2020   HDL 103.30 08/06/2020   LDLCALC 103 (H) 08/06/2020   ALT 16 08/03/2020   AST 19 08/03/2020   NA 138 08/03/2020   K 5.0 08/03/2020   CL 100 08/03/2020   CREATININE 0.69 08/03/2020   BUN 13 08/03/2020   CO2 30 08/03/2020    Assessment and Plan:  Concerned about her change in baseline weakness will have her get a PCR Covid test option she is reluctant to seek urgent ED care  Explained that there are interventions for Covid infection with serious symptoms.  If done early.  may need to be seen in urgent care or ED if dehydrated weak ,may need IV fluids :cannot tell from the phone visit and the patient that is new to me  but she is aware she can try oatmeal  Some nutrition fluids today as her diarrhea has subsided: check temperature.  If persistent fever weakness or any breathing difficulties she should seek emergent ED care. She understands.  Follow Up Instructions:     [id number] [id number] [id number] 11-20 [id number] 21-30 I did not refer this patient for an OV in the next 24 hours for this/these issue(s).    I discussed the assessment and treatment plan with the patient. The patient was provided an opportunity  to ask questions and answered. The patient agreed with the plan and demonstrated an understanding of the instructions.   The patient was advised to call back or seek an in-person evaluation if the symptoms worsen or if the condition fails to improve as anticipated.  I provided * of non-face-to-face time during this encounter. Return if symptoms worsen or fail to improve , fu pcp or ed as indicated.  Berniece Andreas, MD

## 2020-10-19 ENCOUNTER — Other Ambulatory Visit: Payer: Self-pay

## 2020-10-19 ENCOUNTER — Ambulatory Visit (INDEPENDENT_AMBULATORY_CARE_PROVIDER_SITE_OTHER)
Admission: RE | Admit: 2020-10-19 | Discharge: 2020-10-19 | Disposition: A | Discharge status: Home / Self Care | Payer: Medicare Other | Source: Ambulatory Visit | Attending: Family Medicine | Admitting: Family Medicine

## 2020-10-19 DIAGNOSIS — Z780 Asymptomatic menopausal state: Secondary | ICD-10-CM | POA: Diagnosis not present

## 2020-11-23 ENCOUNTER — Ambulatory Visit (INDEPENDENT_AMBULATORY_CARE_PROVIDER_SITE_OTHER): Payer: Medicare Other | Admitting: Family Medicine

## 2020-11-23 ENCOUNTER — Encounter: Payer: Self-pay | Admitting: Family Medicine

## 2020-11-23 ENCOUNTER — Other Ambulatory Visit: Payer: Self-pay

## 2020-11-23 DIAGNOSIS — M8580 Other specified disorders of bone density and structure, unspecified site: Secondary | ICD-10-CM | POA: Insufficient documentation

## 2020-11-23 DIAGNOSIS — I499 Cardiac arrhythmia, unspecified: Secondary | ICD-10-CM | POA: Diagnosis not present

## 2020-11-23 DIAGNOSIS — F419 Anxiety disorder, unspecified: Secondary | ICD-10-CM

## 2020-11-23 DIAGNOSIS — M85859 Other specified disorders of bone density and structure, unspecified thigh: Secondary | ICD-10-CM

## 2020-11-23 MED ORDER — ALENDRONATE SODIUM 70 MG PO TABS
70.0000 mg | ORAL_TABLET | ORAL | 1 refills | Status: AC
Start: 2020-11-23 — End: ?

## 2020-11-23 NOTE — Progress Notes (Signed)
Chief Complaint  Patient presents with   dexa results   HPI: [name] is a 75 y.o. female, who is here today to discussed DEXA results and recommendations.  DEXA on 08/11/20 showed osteopenia. Assessment: the BMD is low according to the [facility] classification for osteoporosis (see below). Fracture risk: moderate FRAX score: 10 year major osteoporotic risk: 14.1%. 10 year hip fracture risk: 5.7%. The thresholds for treatment are 20% and 3%, respectively.  She takes a daily multivitamin with calcium and vitamin D. She exercises regularly, she walks her dog twice daily. No falls in the past year.  She is on chronic treatment with prednisone, recently decreased to 10 mg daily.Started for severe eczema, she follows with dermatologist. + Smoker. Anxiety and depression on sertraline 100 mg daily.  Concerned about elevated ca++. Labs done at her rheumatologist's office. Ca++ 10.8 on 08/03/2020. 25 OH vitamin D normal at 45.  She has not noted abdominal pain, nausea, vomiting, changes in bowel habits, tremor, or MS changes.  Today noted mildly irregular HR, she was resting. Last night she felt a fluttery like sensation in chest for a few seconds. Negative for associated CP, diaphoresis, dyspnea, or dizziness.  She has been under a lot of stress. Planning on moving to [place] on 12/10/20 with her daughter. One of her daughters that lives here in [place] recently found out she is pregnant, she is 21.She would like to stay here in town with her but already promised her other daughter in [place] she will help with her granddaughter care.  Also one of her friends, neighbor, diagnosed with pancreatic cancer a few days ago.  GAD 7 : Generalized Anxiety Score 08/11/2020 08/11/2020  Nervous, Anxious, on Edge 3 0  Control/stop worrying 3 0  Worry too much - different things 3 0  Trouble relaxing 3 0  Restless 2 0  Easily annoyed or irritable 3 0  Afraid - awful might happen 2  0  Total GAD 7 Score 19 0  Anxiety Difficulty Very difficult -   Review of Systems  Constitutional:  Negative for activity change, appetite change and fever.  HENT:  Negative for mouth sores, nosebleeds and trouble swallowing.   Respiratory:  Negative for cough and wheezing.   Cardiovascular:  Negative for leg swelling.  Endocrine: Negative for cold intolerance and heat intolerance.  Genitourinary:  Negative for decreased urine volume, dysuria and hematuria.  Musculoskeletal:  Positive for arthralgias. Negative for gait problem and myalgias.  Neurological:  Negative for syncope, weakness and headaches.  Psychiatric/Behavioral:  Negative for confusion. The patient is nervous/anxious.   Rest see pertinent positives and negatives per HPI.  Current Outpatient Medications on File Prior to Visit  Medication Sig Dispense Refill   albuterol (PROVENTIL HFA) 108 (90 Base) MCG/ACT inhaler Inhale 2 puffs into the lungs every 6 (six) hours as needed for wheezing or shortness of breath. 18 g 6   ALBUTEROL IN Inhale into the lungs as needed.     ascorbic acid (VITAMIN C) 500 MG tablet Take 500 mg by mouth daily.     benazepril-hydrochlorthiazide (LOTENSIN HCT) 20-12.5 MG tablet Take 1 tablet by mouth daily. 90 tablet 3   COLLAGEN PO Take by mouth.     diclofenac Sodium (VOLTAREN) 1 % GEL Apply topically as needed.     Multiple Vitamin (MULTIVITAMIN) tablet Take 1 tablet by mouth daily.     Nettle, Urtica Dioica, (NETTLE LEAF PO) Take 500 mg by mouth as  needed.     predniSONE (DELTASONE) 5 MG tablet TAKE 1 TABLET BY MOUTH EVERY DAY WITH BREAKFAST 180 tablet 0   sertraline (ZOLOFT) 100 MG tablet Take 0.5 tablets (50 mg total) by mouth daily. 90 tablet 2   tiotropium (SPIRIVA HANDIHALER) 18 MCG inhalation capsule Place 1 capsule (18 mcg total) into inhaler and inhale daily. 90 capsule 3   WIXELA INHUB 250-50 MCG/DOSE AEPB USE 1 INHALATION ORALLY    TWICE DAILY 180 each 2   No current  facility-administered medications on file prior to visit.   Past Medical History:  Diagnosis Date   Alcohol abuse    Allergy    Arthritis    Asthma    Depression    Emphysema of lung (HCC)    History of chicken pox    Hypertension    Urine incontinence    Allergies  Allergen Reactions   Augmentin [Amoxicillin-Pot Clavulanate] Diarrhea    Social History   Socioeconomic History   Marital status: Widowed    Spouse name: Not on file   Number of children: 3   Years of education: Not on file   Highest education level: Not on file  Occupational History   Not on file  Tobacco Use   Smoking status: Every Day    Packs/day: 1.50    Pack years: 0.00    Types: Cigarettes   Smokeless tobacco: Never  Vaping Use   Vaping Use: Former  Substance and Sexual Activity   Alcohol use: Not Currently   Drug use: Never   Sexual activity: Not Currently  Other Topics Concern   Not on file  Social History Narrative   Not on file   Social Determinants of Health   Financial Resource Strain: Low Risk    Difficulty of Paying Living Expenses: Not hard at all  Food Insecurity: No Food Insecurity   Worried About Programme researcher, broadcasting/film/video in the Last Year: Never true   Ran Out of Food in the Last Year: Never true  Transportation Needs: No Transportation Needs   Lack of Transportation (Medical): No   Lack of Transportation (Non-Medical): No  Physical Activity: Sufficiently Active   Days of Exercise per Week: 7 days   Minutes of Exercise per Session: 30 min  Stress: No Stress Concern Present   Feeling of Stress : Not at all  Social Connections: Moderately Isolated   Frequency of Communication with Friends and Family: More than three times a week   Frequency of Social Gatherings with Friends and Family: Once a week   Attends Religious Services: Never   Database administrator or Organizations: Yes   Attends Banker Meetings: 1 to 4 times per year   Marital Status: Widowed     Vitals:   11/23/20 1604  BP: 120/70  Pulse: (!) 101  Resp: 16  SpO2: 93%   Body mass index is 22.4 kg/m.  Physical Exam Vitals and nursing note reviewed.  Constitutional:      General: She is not in acute distress.    Appearance: She is well-developed.  HENT:     Head: Normocephalic and atraumatic.     Mouth/Throat:     Mouth: Mucous membranes are moist.  Eyes:     Conjunctiva/sclera: Conjunctivae normal.  Cardiovascular:     Rate and Rhythm: Normal rate. Rhythm irregular. Occasional Extrasystoles are present.    Heart sounds: No murmur heard.    Comments: HR: 92/min. DP pulses present.  Pulmonary:  Effort: Pulmonary effort is normal. No respiratory distress.     Breath sounds: Normal breath sounds.  Abdominal:     Palpations: Abdomen is soft. There is no hepatomegaly or mass.     Tenderness: There is no abdominal tenderness.  Lymphadenopathy:     Cervical: No cervical adenopathy.  Skin:    General: Skin is warm.     Findings: No erythema.  Neurological:     General: No focal deficit present.     Mental Status: She is alert and oriented to person, place, and time.     Cranial Nerves: No cranial nerve deficit.     Gait: Gait normal.  Psychiatric:        Mood and Affect: Mood is anxious.     Comments: Well groomed, good eye contact.   ASSESSMENT AND PLAN:  Lorraine Fitzgerald was seen today for dexa results.  Diagnoses and all orders for this visit: Orders Placed This Encounter  Procedures   EKG 12-Lead   Hypercalcemia Mild. We discussed possible etiologies. She would like to hold on further work-up until she moved to [place], when she can establish with a new PCP.  Osteopenia of neck of femur, unspecified laterality We reviewed DEXA results, FRAX score, and her risk for fractures. Some of her medications and tobacco use can aggravate problem. We discussed treatment options as well as side effects. She agrees with trying Fosamax weekly. She recently  had a dental procedure, she has a follow-up appointment in a few days, instructed to start Fosamax after dental procedure has been completed. Adequate calcium and vitamin D intake to continue. Fall prevention. Weightbearing exercises twice per week and regular physical activity, low impact.  -     alendronate (FOSAMAX) 70 MG tablet; Take 1 tablet (70 mg total) by mouth every 7 (seven) days. Take with a full glass of water on an empty stomach.  Irregular heart rate We discussed possible causes. Currently asymptomatic. Smoking cessation encouraged. EKG today SR,normal axis and intervals,? LAE, and PVC. No other EKG for comparison. She is moving in 2 weeks. Cardiology evaluation is appropriate, she can discuss this with new PCP in [place].  Thought how to check pulse, instructed to monitor HR regularly. Clearly instructed about warning signs, in which case she was instructed to go to the ER. She was understanding and agrees with plan.  Anxiety disorder, unspecified type Mildly exacerbated by recent events and life changes. Continue sertraline 100 mg daily.  Spent 42 minutes.  During this time history was obtained and documented, examination was performed, prior labs/imaging reviewed, and assessment/plan discussed.  Return in about 2 months (around 01/23/2021) for new pcp.   Lorraine Paule G. [place], MD  [facility]. [place] office.   A few things to remember from today's visit:  Hypercalcemia  Osteopenia of neck of femur, unspecified laterality  Irregular heart rate - Plan: EKG 12-Lead  If you need refills please call your pharmacy. Do not use My Chart to request refills or for acute issues that need immediate attention.   Fosamax to start after your dental follow up. Please have your calcium re-check in 2-3 months.  Please be sure medication list is accurate. If a new problem present, please set up appointment sooner than planned today.

## 2020-11-23 NOTE — Patient Instructions (Addendum)
A few things to remember from today's visit:  Hypercalcemia  Osteopenia of neck of femur, unspecified laterality  Irregular heart rate - Plan: EKG 12-Lead  If you need refills please call your pharmacy. Do not use My Chart to request refills or for acute issues that need immediate attention.   Fosamax to start after your dental follow up. Please have your calcium re-check in 2-3 months.  Please be sure medication list is accurate. If a new problem present, please set up appointment sooner than planned today.

## 2021-02-26 DIAGNOSIS — N390 Urinary tract infection, site not specified: Secondary | ICD-10-CM | POA: Diagnosis not present

## 2021-02-26 DIAGNOSIS — J441 Chronic obstructive pulmonary disease with (acute) exacerbation: Secondary | ICD-10-CM | POA: Diagnosis not present

## 2021-02-26 DIAGNOSIS — R0602 Shortness of breath: Secondary | ICD-10-CM | POA: Diagnosis not present

## 2021-02-26 DIAGNOSIS — M545 Low back pain, unspecified: Secondary | ICD-10-CM | POA: Diagnosis not present

## 2021-03-21 ENCOUNTER — Other Ambulatory Visit: Payer: Self-pay | Admitting: Internal Medicine

## 2021-03-21 DIAGNOSIS — L309 Dermatitis, unspecified: Secondary | ICD-10-CM

## 2021-03-21 DIAGNOSIS — M159 Polyosteoarthritis, unspecified: Secondary | ICD-10-CM

## 2021-03-21 DIAGNOSIS — R5383 Other fatigue: Secondary | ICD-10-CM

## 2021-03-22 NOTE — Telephone Encounter (Signed)
Next Visit: Not scheduled, PLEASE ADVISE  Last Visit: 08/03/2020  Last Fill: 08/03/2020  Dx:  Generalized osteoarthritis   Current Dose per office note on 08/03/2020: predniSONE (DELTASONE) 5 MG tablet  Okay to refill prednisone?

## 2021-03-22 NOTE — Telephone Encounter (Signed)
Attempted to contact patient and left message on machine to advise patient we could not refill the prednisone prescription since she has moved and that Dr. Dimple Casey recommends establishing care with a local provider if needing ongoing treatment with steroids.

## 2021-03-22 NOTE — Telephone Encounter (Signed)
Patient moved to [place] to help care for her granddaughter, I recommend her to establish care with a local provider if needing ongoing treatment with steroids.

## 2021-04-12 DIAGNOSIS — J449 Chronic obstructive pulmonary disease, unspecified: Secondary | ICD-10-CM | POA: Diagnosis not present

## 2021-04-12 DIAGNOSIS — F419 Anxiety disorder, unspecified: Secondary | ICD-10-CM | POA: Diagnosis not present

## 2021-04-12 DIAGNOSIS — M159 Polyosteoarthritis, unspecified: Secondary | ICD-10-CM | POA: Diagnosis not present

## 2021-04-12 DIAGNOSIS — M8580 Other specified disorders of bone density and structure, unspecified site: Secondary | ICD-10-CM | POA: Diagnosis not present

## 2021-04-12 DIAGNOSIS — F321 Major depressive disorder, single episode, moderate: Secondary | ICD-10-CM | POA: Diagnosis not present

## 2021-04-12 DIAGNOSIS — E785 Hyperlipidemia, unspecified: Secondary | ICD-10-CM | POA: Diagnosis not present

## 2021-04-12 DIAGNOSIS — L309 Dermatitis, unspecified: Secondary | ICD-10-CM | POA: Diagnosis not present

## 2021-04-12 DIAGNOSIS — I10 Essential (primary) hypertension: Secondary | ICD-10-CM | POA: Diagnosis not present

## 2021-04-12 DIAGNOSIS — F17200 Nicotine dependence, unspecified, uncomplicated: Secondary | ICD-10-CM | POA: Diagnosis not present

## 2021-04-22 DIAGNOSIS — I10 Essential (primary) hypertension: Secondary | ICD-10-CM | POA: Diagnosis not present

## 2021-07-28 DIAGNOSIS — Z0000 Encounter for general adult medical examination without abnormal findings: Secondary | ICD-10-CM | POA: Diagnosis not present

## 2021-07-28 DIAGNOSIS — R7301 Impaired fasting glucose: Secondary | ICD-10-CM | POA: Diagnosis not present

## 2021-07-28 DIAGNOSIS — E559 Vitamin D deficiency, unspecified: Secondary | ICD-10-CM | POA: Diagnosis not present

## 2021-07-28 DIAGNOSIS — I10 Essential (primary) hypertension: Secondary | ICD-10-CM | POA: Diagnosis not present

## 2021-07-28 DIAGNOSIS — Z1159 Encounter for screening for other viral diseases: Secondary | ICD-10-CM | POA: Diagnosis not present

## 2021-07-28 DIAGNOSIS — E785 Hyperlipidemia, unspecified: Secondary | ICD-10-CM | POA: Diagnosis not present

## 2021-08-12 DIAGNOSIS — F419 Anxiety disorder, unspecified: Secondary | ICD-10-CM | POA: Diagnosis not present

## 2021-08-12 DIAGNOSIS — L309 Dermatitis, unspecified: Secondary | ICD-10-CM | POA: Diagnosis not present

## 2021-08-12 DIAGNOSIS — M8580 Other specified disorders of bone density and structure, unspecified site: Secondary | ICD-10-CM | POA: Diagnosis not present

## 2021-08-12 DIAGNOSIS — F321 Major depressive disorder, single episode, moderate: Secondary | ICD-10-CM | POA: Diagnosis not present

## 2021-08-12 DIAGNOSIS — J449 Chronic obstructive pulmonary disease, unspecified: Secondary | ICD-10-CM | POA: Diagnosis not present

## 2021-08-12 DIAGNOSIS — I10 Essential (primary) hypertension: Secondary | ICD-10-CM | POA: Diagnosis not present

## 2021-08-12 DIAGNOSIS — M159 Polyosteoarthritis, unspecified: Secondary | ICD-10-CM | POA: Diagnosis not present

## 2021-09-08 DIAGNOSIS — Z20822 Contact with and (suspected) exposure to covid-19: Secondary | ICD-10-CM | POA: Diagnosis not present
